# Patient Record
Sex: Female | Born: 1950
Health system: Southern US, Community
[De-identification: ages and names within clinical notes are randomized; demographics above are authoritative.]

## PROBLEM LIST (undated history)

## (undated) DIAGNOSIS — C4491 Basal cell carcinoma of skin, unspecified: Secondary | ICD-10-CM

## (undated) DIAGNOSIS — H409 Unspecified glaucoma: Secondary | ICD-10-CM

## (undated) DIAGNOSIS — Z8711 Personal history of peptic ulcer disease: Secondary | ICD-10-CM

## (undated) DIAGNOSIS — R011 Cardiac murmur, unspecified: Secondary | ICD-10-CM

## (undated) DIAGNOSIS — T7840XA Allergy, unspecified, initial encounter: Secondary | ICD-10-CM

## (undated) DIAGNOSIS — H269 Unspecified cataract: Secondary | ICD-10-CM

## (undated) DIAGNOSIS — Z9289 Personal history of other medical treatment: Secondary | ICD-10-CM

## (undated) DIAGNOSIS — K219 Gastro-esophageal reflux disease without esophagitis: Secondary | ICD-10-CM

## (undated) DIAGNOSIS — Z86010 Personal history of colonic polyps: Secondary | ICD-10-CM

## (undated) DIAGNOSIS — Z9889 Other specified postprocedural states: Secondary | ICD-10-CM

## (undated) DIAGNOSIS — Z8719 Personal history of other diseases of the digestive system: Secondary | ICD-10-CM

## (undated) DIAGNOSIS — R112 Nausea with vomiting, unspecified: Secondary | ICD-10-CM

## (undated) DIAGNOSIS — B019 Varicella without complication: Secondary | ICD-10-CM

## (undated) HISTORY — DX: Personal history of peptic ulcer disease: Z87.11

## (undated) HISTORY — DX: Basal cell carcinoma of skin, unspecified: C44.91

## (undated) HISTORY — DX: Personal history of other diseases of the digestive system: Z87.19

## (undated) HISTORY — DX: Allergy, unspecified, initial encounter: T78.40XA

## (undated) HISTORY — DX: Personal history of other medical treatment: Z92.89

## (undated) HISTORY — PX: SKIN CANCER EXCISION: SHX779

## (undated) HISTORY — DX: Cardiac murmur, unspecified: R01.1

## (undated) HISTORY — DX: Gastro-esophageal reflux disease without esophagitis: K21.9

## (undated) HISTORY — DX: Unspecified glaucoma: H40.9

## (undated) HISTORY — DX: Personal history of colonic polyps: Z86.010

## (undated) HISTORY — DX: Varicella without complication: B01.9

## (undated) HISTORY — PX: TUBAL LIGATION: SHX77

## (undated) HISTORY — DX: Nausea with vomiting, unspecified: Z98.890

## (undated) HISTORY — PX: CATARACT EXTRACTION, BILATERAL: SHX1313

## (undated) HISTORY — DX: Nausea with vomiting, unspecified: R11.2

## (undated) HISTORY — DX: Unspecified cataract: H26.9

---

## 1971-12-24 HISTORY — PX: MANDIBLE FRACTURE SURGERY: SHX706

## 2013-01-28 DIAGNOSIS — H269 Unspecified cataract: Secondary | ICD-10-CM | POA: Insufficient documentation

## 2013-01-28 DIAGNOSIS — H353190 Nonexudative age-related macular degeneration, unspecified eye, stage unspecified: Secondary | ICD-10-CM | POA: Insufficient documentation

## 2013-01-28 DIAGNOSIS — H43819 Vitreous degeneration, unspecified eye: Secondary | ICD-10-CM | POA: Insufficient documentation

## 2013-01-28 DIAGNOSIS — H15839 Staphyloma posticum, unspecified eye: Secondary | ICD-10-CM | POA: Insufficient documentation

## 2013-01-28 DIAGNOSIS — H4420 Degenerative myopia, unspecified eye: Secondary | ICD-10-CM | POA: Insufficient documentation

## 2013-01-28 DIAGNOSIS — H35439 Paving stone degeneration of retina, unspecified eye: Secondary | ICD-10-CM | POA: Insufficient documentation

## 2013-04-15 DIAGNOSIS — H35059 Retinal neovascularization, unspecified, unspecified eye: Secondary | ICD-10-CM | POA: Insufficient documentation

## 2013-04-15 DIAGNOSIS — H5213 Myopia, bilateral: Secondary | ICD-10-CM | POA: Insufficient documentation

## 2013-04-15 DIAGNOSIS — H43819 Vitreous degeneration, unspecified eye: Secondary | ICD-10-CM | POA: Insufficient documentation

## 2016-09-10 ENCOUNTER — Ambulatory Visit (INDEPENDENT_AMBULATORY_CARE_PROVIDER_SITE_OTHER): Payer: Commercial Managed Care - HMO | Admitting: Internal Medicine

## 2016-09-10 ENCOUNTER — Encounter: Payer: Self-pay | Admitting: Internal Medicine

## 2016-09-10 VITALS — BP 120/76 | HR 89 | Temp 97.8°F | Ht 65.0 in | Wt 195.8 lb

## 2016-09-10 DIAGNOSIS — R5383 Other fatigue: Secondary | ICD-10-CM | POA: Diagnosis not present

## 2016-09-10 DIAGNOSIS — Z23 Encounter for immunization: Secondary | ICD-10-CM

## 2016-09-10 DIAGNOSIS — E559 Vitamin D deficiency, unspecified: Secondary | ICD-10-CM

## 2016-09-10 LAB — COMPREHENSIVE METABOLIC PANEL
ALK PHOS: 71 U/L (ref 39–117)
ALT: 13 U/L (ref 0–35)
AST: 15 U/L (ref 0–37)
Albumin: 4.1 g/dL (ref 3.5–5.2)
BILIRUBIN TOTAL: 0.4 mg/dL (ref 0.2–1.2)
BUN: 15 mg/dL (ref 6–23)
CALCIUM: 9.4 mg/dL (ref 8.4–10.5)
CO2: 32 mEq/L (ref 19–32)
Chloride: 101 mEq/L (ref 96–112)
Creatinine, Ser: 0.56 mg/dL (ref 0.40–1.20)
GFR: 115.57 mL/min (ref >60.00)
GLUCOSE: 88 mg/dL (ref 70–99)
POTASSIUM: 5.1 meq/L (ref 3.5–5.1)
SODIUM: 138 meq/L (ref 135–145)
TOTAL PROTEIN: 6.9 g/dL (ref 6.0–8.3)

## 2016-09-10 LAB — CBC
HCT: 37.7 % (ref 36.0–46.0)
Hemoglobin: 12.8 g/dL (ref 12.0–15.0)
MCHC: 34 g/dL (ref 30.0–36.0)
MCV: 88.6 fl (ref 78.0–100.0)
Platelets: 331 10*3/uL (ref 150.0–400.0)
RBC: 4.26 Mil/uL (ref 3.87–5.11)
RDW: 14.6 % (ref 11.5–15.5)
WBC: 7.3 10*3/uL (ref 4.0–10.5)

## 2016-09-10 LAB — TSH: TSH: 1.91 u[IU]/mL (ref 0.35–4.50)

## 2016-09-10 LAB — VITAMIN D 25 HYDROXY (VIT D DEFICIENCY, FRACTURES): VITD: 23.56 ng/mL — AB (ref 30.00–100.00)

## 2016-09-10 LAB — VITAMIN B12: Vitamin B-12: 367 pg/mL (ref 211–911)

## 2016-09-10 LAB — FOLATE: FOLATE: 7.2 ng/mL (ref >5.9)

## 2016-09-10 NOTE — Progress Notes (Signed)
HPI  Seasonal Allergies: Worse in the spring and summer. It seems worse when it rains a lot. She takes Zyrtec daily with good relief.  History of Basal Cell Carcinoma: Removed off her face and left forearm. She is not currently following with a dermatologist.  GERD: Triggered by spicy food or foods containing MSG. She does not take anything for this.   History of Stomach Ulcers: 40 + years ago. She does not remember the cause of this. She has no current issues with this.  She also c/o fatigue. She reports this has been going on for a few months. She is sleeping at least 7 hours of sleep at night, she reports she does not feel rested when she wakes up. She denies nocturia. She is not sure if she snores.  Flu: 09/2015 Tetanus: unsure Zostovax: never Pap Smear: 2014 at gyn in [place]: 2014 at [place]: never Vision Screening: biannually Dentist: as needed  Past Medical History:  Diagnosis Date  . Allergy   . Basal cell carcinoma   . Chicken pox   . GERD (gastroesophageal reflux disease)   . History of blood transfusion   . History of stomach ulcers     Current Outpatient Prescriptions  Medication Sig Dispense Refill  . Calcium Carb-Cholecalciferol (CALCIUM 600/VITAMIN D3) 600-800 MG-UNIT TABS Take 2 tablets by mouth daily.    . cetirizine (ZYRTEC) 10 MG tablet Take 10 mg by mouth daily.    . Multiple Vitamins-Minerals (VISION FORMULA EYE HEALTH) CAPS Take 2 capsules by mouth daily.    . Multiple Vitamins-Minerals (WOMENS MULTIVITAMIN PO) Take 1 tablet by mouth daily.    Vladimir Faster Glycol-Propyl Glycol (SYSTANE OP) Apply 1 drop to eye daily.     No current facility-administered medications for this visit.     Allergies  Allergen Reactions  . Phenobarbital Other (See Comments)    syncope  . Codeine Nausea And Vomiting    Family History  Problem Relation Age of Onset  . Arthritis Mother   . Uterine cancer Mother   . Diabetes Mother   .  Heart disease Father   . Cancer Maternal Uncle   . Throat cancer Maternal Grandmother   . Diabetes Maternal Grandmother   . Prostate cancer Maternal Grandfather     Social History   Social History  . Marital status: Divorced    Spouse name: N/A  . Number of children: N/A  . Years of education: N/A   Occupational History  . Not on file.   Social History Main Topics  . Smoking status: Former Research scientist (life sciences)  . Smokeless tobacco: Never Used  . Alcohol use Yes  . Drug use: Unknown  . Sexual activity: Not on file   Other Topics Concern  . Not on file   Social History Narrative  . No narrative on file    ROS:  Constitutional: Pt reports fatigue. Denies fever, malaise, headache or abrupt weight changes.  HEENT: Denies eye pain, eye redness, ear pain, ringing in the ears, wax buildup, runny nose, nasal congestion, bloody nose, or sore throat. Respiratory: Denies difficulty breathing, shortness of breath, cough or sputum production.   Cardiovascular: Denies chest pain, chest tightness, palpitations or swelling in the hands or feet.  Gastrointestinal: Denies abdominal pain, bloating, constipation, diarrhea or blood in the stool.  GU: Denies frequency, urgency, pain with urination, blood in urine, odor or discharge. Musculoskeletal: Denies decrease in range of motion, difficulty with gait, muscle pain or joint pain and swelling.  Skin: Denies redness, rashes, lesions or ulcercations.  Neurological: Denies dizziness, difficulty with memory, difficulty with speech or problems with balance and coordination.  Psych: Denies anxiety, depression, SI/HI.  No other specific complaints in a complete review of systems (except as listed in HPI above).  PE:  BP 120/76   Pulse 89   Temp 97.8 F (36.6 C) (Oral)   Ht 5\' 5"  (1.651 m)   Wt 195 lb 12 oz (88.8 kg)   SpO2 99%   BMI 32.57 kg/m  Wt Readings from Last 3 Encounters:  09/10/16 195 lb 12 oz (88.8 kg)    General: Appears her stated  age, well developed, well nourished in NAD. Neck: Neck supple, trachea midline. No masses, lumps or thyromegaly present.  Cardiovascular: Normal rate and rhythm. S1,S2 noted.  No murmur, rubs or gallops noted.  Pulmonary/Chest: Normal effort and positive vesicular breath sounds. No respiratory distress. No wheezes, rales or ronchi noted.  Musculoskeletal:  No difficulty with gait.  Neurological: Alert and oriented.  Psychiatric: Mood and affect normal. Behavior is normal. Judgment and thought content normal.    Assessment and Plan:  Fatigue:  Will check CBC, CMET, A1C, TSH, B12, Folate and Vit D levels today Flu shot today  Will follow up after labs, make an appt for your annual exam Webb Silversmith, NP

## 2016-09-10 NOTE — Patient Instructions (Signed)
Fatigue  Fatigue is feeling tired all of the time, a lack of energy, or a lack of motivation. Occasional or mild fatigue is often a normal response to activity or life in general. However, long-lasting (chronic) or extreme fatigue may indicate an underlying medical condition.  HOME CARE INSTRUCTIONS   Watch your fatigue for any changes. The following actions may help to lessen any discomfort you are feeling:  · Talk to your health care provider about how much sleep you need each night. Try to get the required amount every night.  · Take medicines only as directed by your health care provider.  · Eat a healthy and nutritious diet. Ask your health care provider if you need help changing your diet.  · Drink enough fluid to keep your urine clear or pale yellow.  · Practice ways of relaxing, such as yoga, meditation, massage therapy, or acupuncture.  · Exercise regularly.    · Change situations that cause you stress. Try to keep your work and personal routine reasonable.  · Do not abuse illegal drugs.  · Limit alcohol intake to no more than 1 drink per day for nonpregnant women and 2 drinks per day for men. One drink equals 12 ounces of beer, 5 ounces of wine, or 1½ ounces of hard liquor.  · Take a multivitamin, if directed by your health care provider.  SEEK MEDICAL CARE IF:   · Your fatigue does not get better.  · You have a fever.    · You have unintentional weight loss or gain.  · You have headaches.    · You have difficulty:      Falling asleep.    Sleeping throughout the night.  · You feel angry, guilty, anxious, or sad.     · You are unable to have a bowel movement (constipation).    · You skin is dry.     · Your legs or another part of your body is swollen.    SEEK IMMEDIATE MEDICAL CARE IF:   · You feel confused.    · Your vision is blurry.  · You feel faint or pass out.    · You have a severe headache.    · You have severe abdominal, pelvic, or back pain.    · You have chest pain, shortness of breath, or an  irregular or fast heartbeat.    · You are unable to urinate or you urinate less than normal.    · You develop abnormal bleeding, such as bleeding from the rectum, vagina, nose, lungs, or nipples.  · You vomit blood.     · You have thoughts about harming yourself or committing suicide.    · You are worried that you might harm someone else.       This information is not intended to replace advice given to you by your health care provider. Make sure you discuss any questions you have with your health care provider.     Document Released: 10/06/2007 Document Revised: 12/30/2014 Document Reviewed: 04/12/2014  Elsevier Interactive Patient Education ©2016 [employer]

## 2016-09-10 NOTE — Addendum Note (Signed)
Addended by: Lurlean Nanny on: 09/10/2016 02:59 PM   Modules accepted: Orders

## 2016-09-11 MED ORDER — VITAMIN D (ERGOCALCIFEROL) 1.25 MG (50000 UNIT) PO CAPS: 50000.0000 [IU] | ORAL_CAPSULE | ORAL | 0 refills | Status: DC

## 2016-09-11 NOTE — Addendum Note (Signed)
Addended by: Lindalou Hose Y on: 09/11/2016 05:00 PM   Modules accepted: Orders

## 2016-12-03 ENCOUNTER — Other Ambulatory Visit: Payer: Self-pay | Admitting: Internal Medicine

## 2016-12-03 DIAGNOSIS — E559 Vitamin D deficiency, unspecified: Secondary | ICD-10-CM

## 2017-06-04 DIAGNOSIS — H442E3 Degenerative myopia with other maculopathy, bilateral eye: Secondary | ICD-10-CM | POA: Diagnosis not present

## 2017-06-04 DIAGNOSIS — H43813 Vitreous degeneration, bilateral: Secondary | ICD-10-CM | POA: Diagnosis not present

## 2017-06-04 DIAGNOSIS — H35433 Paving stone degeneration of retina, bilateral: Secondary | ICD-10-CM | POA: Diagnosis not present

## 2017-07-17 ENCOUNTER — Encounter: Payer: Self-pay | Admitting: Internal Medicine

## 2017-07-17 ENCOUNTER — Ambulatory Visit (INDEPENDENT_AMBULATORY_CARE_PROVIDER_SITE_OTHER): Payer: Medicare Other | Admitting: Internal Medicine

## 2017-07-17 VITALS — BP 118/70 | HR 78 | Temp 98.0°F | Ht 64.75 in | Wt 193.5 lb

## 2017-07-17 DIAGNOSIS — Z0000 Encounter for general adult medical examination without abnormal findings: Secondary | ICD-10-CM

## 2017-07-17 DIAGNOSIS — Z1159 Encounter for screening for other viral diseases: Secondary | ICD-10-CM | POA: Diagnosis not present

## 2017-07-17 DIAGNOSIS — Z23 Encounter for immunization: Secondary | ICD-10-CM

## 2017-07-17 DIAGNOSIS — Z1211 Encounter for screening for malignant neoplasm of colon: Secondary | ICD-10-CM

## 2017-07-17 DIAGNOSIS — Z114 Encounter for screening for human immunodeficiency virus [HIV]: Secondary | ICD-10-CM

## 2017-07-17 NOTE — Progress Notes (Signed)
HPI:  Pt presents to the clinic today for her Welcome to Medicare Exam.  Past Medical History:  Diagnosis Date  . Allergy   . Basal cell carcinoma   . Chicken pox   . GERD (gastroesophageal reflux disease)   . History of blood transfusion   . History of stomach ulcers     Current Outpatient Prescriptions  Medication Sig Dispense Refill  . Calcium Carb-Cholecalciferol (CALCIUM 600/VITAMIN D3) 600-800 MG-UNIT TABS Take 2 tablets by mouth daily.    . cetirizine (ZYRTEC) 10 MG tablet Take 10 mg by mouth daily.    . Multiple Vitamins-Minerals (VISION FORMULA EYE HEALTH) CAPS Take 2 capsules by mouth daily.    . Multiple Vitamins-Minerals (WOMENS MULTIVITAMIN PO) Take 1 tablet by mouth daily.    Vladimir Faster Glycol-Propyl Glycol (SYSTANE OP) Apply 1 drop to eye daily.    . Vitamin D, Ergocalciferol, (DRISDOL) 50000 units CAPS capsule Take 1 capsule (50,000 Units total) by mouth every 7 (seven) days. 12 capsule 0   No current facility-administered medications for this visit.     Allergies  Allergen Reactions  . Phenobarbital Other (See Comments)    syncope  . Codeine Nausea And Vomiting    Family History  Problem Relation Age of Onset  . Arthritis Mother   . Uterine cancer Mother   . Diabetes Mother   . Heart disease Father   . Cancer Maternal Uncle   . Throat cancer Maternal Grandmother   . Diabetes Maternal Grandmother   . Prostate cancer Maternal Grandfather     Social History   Social History  . Marital status: Divorced    Spouse name: N/A  . Number of children: N/A  . Years of education: N/A   Occupational History  . Not on file.   Social History Main Topics  . Smoking status: Former Research scientist (life sciences)  . Smokeless tobacco: Never Used  . Alcohol use 0.6 oz/week    1 Shots of liquor per week  . Drug use: No  . Sexual activity: Not Currently   Other Topics Concern  . Not on file   Social History Narrative  . No narrative on file    Hospitiliaztions:  None  Health Maintenance:    Flu: 08/2016  Tetanus: unsure  Pneumovax: never  Prevnar: never  Zostavax: never  Mammogram: 2014  Pap Smear: 2014  Bone Density: 2014  Colon Screening: never   Eye Doctor: biannaully  Dental Exam: as needed   Providers:   PCP: Webb Silversmith, NP-C   I have personally reviewed and have noted:  1. The patient's medical and social history 2. Their use of alcohol, tobacco or illicit drugs 3. Their current medications and supplements 4. The patient's functional ability including ADL's, fall risks, home safety risks and hearing or visual impairment. 5. Diet and physical activities 6. Evidence for depression or mood disorder  Subjective:   Review of Systems:   Constitutional: Denies fever, malaise, fatigue, headache or abrupt weight changes.  HEENT: Denies eye pain, eye redness, ear pain, ringing in the ears, wax buildup, runny nose, nasal congestion, bloody nose, or sore throat. Respiratory: Denies difficulty breathing, shortness of breath, cough or sputum production.   Cardiovascular: Denies chest pain, chest tightness, palpitations or swelling in the hands or feet.  Gastrointestinal: Denies abdominal pain, bloating, constipation, diarrhea or blood in the stool.  GU: Denies urgency, frequency, pain with urination, burning sensation, blood in urine, odor or discharge. Musculoskeletal: Denies decrease in range of motion, difficulty with  gait, muscle pain or joint pain and swelling.  Skin: Denies redness, rashes, lesions or ulcercations.  Neurological: Denies dizziness, difficulty with memory, difficulty with speech or problems with balance and coordination.  Psych: Denies anxiety, depression, SI/HI.  No other specific complaints in a complete review of systems (except as listed in HPI above).  Objective:  PE:   BP 118/70   Pulse 78   Temp 98 F (36.7 C) (Oral)   Ht 5' 4.75" (1.645 m)   Wt 193 lb 8 oz (87.8 kg)   SpO2 98%   BMI 32.45 kg/m    Wt Readings from Last 3 Encounters:  09/10/16 195 lb 12 oz (88.8 kg)    General: Appears herstated age, obese in NAD. Skin: Warm, dry and intact.  HEENT: Head: normal shape and size; Eyes: sclera white, no icterus, conjunctiva pink, PERRLA and EOMs intact; Ears: Tm's gray and intact, normal light reflex; Throat/Mouth: Teeth present, mucosa pink and moist, no exudate, lesions or ulcerations noted.  Neck: Neck supple, trachea midline. No masses, lumps or thyromegaly present.  Cardiovascular: Normal rate and rhythm. S1,S2 noted.  Slight murmur noted. No JVD or BLE edema. No carotid bruits noted. Pulmonary/Chest: Normal effort and positive vesicular breath sounds. No respiratory distress. No wheezes, rales or ronchi noted.  Abdomen: Soft and nontender. Normal bowel sounds. No distention or masses noted. Liver, spleen and kidneys non palpable. Musculoskeletal: Strength 5/5 BUE/BLE. No signs of joint swelling.  Neurological: Alert and oriented. Cranial nerves II-XII grossly intact. Coordination normal.  Psychiatric: Mood and affect normal. Behavior is normal. Judgment and thought content normal.    BMET    Component Value Date/Time   NA 138 09/10/2016 1027   K 5.1 09/10/2016 1027   CL 101 09/10/2016 1027   CO2 32 09/10/2016 1027   GLUCOSE 88 09/10/2016 1027   BUN 15 09/10/2016 1027   CREATININE 0.56 09/10/2016 1027   CALCIUM 9.4 09/10/2016 1027    Lipid Panel  No results found for: CHOL, TRIG, HDL, CHOLHDL, VLDL, LDLCALC  CBC    Component Value Date/Time   WBC 7.3 09/10/2016 1027   RBC 4.26 09/10/2016 1027   HGB 12.8 09/10/2016 1027   HCT 37.7 09/10/2016 1027   PLT 331.0 09/10/2016 1027   MCV 88.6 09/10/2016 1027   MCHC 34.0 09/10/2016 1027   RDW 14.6 09/10/2016 1027    Hgb A1C No results found for: HGBA1C    Assessment and Plan:   Medicare Annual Wellness Visit:  Diet: She does eat meat. She consumes fruits and veggies daily. She does not eat fried foods. She  drinks mostly coffee and water. Physical activity: None Depression/mood screen: Negative Hearing: Intact to whispered voice Visual acuity: Grossly normal, performs annual eye exam  ADLs: Capable Fall risk: None Home safety: Good Cognitive evaluation: Intact to orientation, naming, recall and repetition EOL planning: Adv directives, DNR/I disagree  Preventative Medicine: Encouraged her to get a flu shot in the fall. She declines tetanus booster due to financial reasons. Prevnar today. She will get Pneumovax in 1 year. Advised her to call her insurance to find out about the coverage of Shingrix. She declines pap smear, mammorgam or bone density. Referral placed to GI for screening colonoscopy. Encouraged her to consume a balanced diet and exercise regimen. Advised her to see an eye doctor and dentist annually. Will check CBC, CMET, Lipid, HIV and Hep C today. ECG today normal.    Next appointment: 1 year, Medicare Wellness Exam   Mirielle Byrum,  Quindell Shere, NP

## 2017-07-17 NOTE — Patient Instructions (Signed)
Health Maintenance for Postmenopausal Women Menopause is a normal process in which your reproductive ability comes to an end. This process happens gradually over a span of months to years, usually between the ages of 22 and 9. Menopause is complete when you have missed 12 consecutive menstrual periods. It is important to talk with your health care provider about some of the most common conditions that affect postmenopausal women, such as heart disease, cancer, and bone loss (osteoporosis). Adopting a healthy lifestyle and getting preventive care can help to promote your health and wellness. Those actions can also lower your chances of developing some of these common conditions. What should I know about menopause? During menopause, you may experience a number of symptoms, such as:  Moderate-to-severe hot flashes.  Night sweats.  Decrease in sex drive.  Mood swings.  Headaches.  Tiredness.  Irritability.  Memory problems.  Insomnia.  Choosing to treat or not to treat menopausal changes is an individual decision that you make with your health care provider. What should I know about hormone replacement therapy and supplements? Hormone therapy products are effective for treating symptoms that are associated with menopause, such as hot flashes and night sweats. Hormone replacement carries certain risks, especially as you become older. If you are thinking about using estrogen or estrogen with progestin treatments, discuss the benefits and risks with your health care provider. What should I know about heart disease and stroke? Heart disease, heart attack, and stroke become more likely as you age. This may be due, in part, to the hormonal changes that your body experiences during menopause. These can affect how your body processes dietary fats, triglycerides, and cholesterol. Heart attack and stroke are both medical emergencies. There are many things that you can do to help prevent heart disease  and stroke:  Have your blood pressure checked at least every 1-2 years. High blood pressure causes heart disease and increases the risk of stroke.  If you are 53-22 years old, ask your health care provider if you should take aspirin to prevent a heart attack or a stroke.  Do not use any tobacco products, including cigarettes, chewing tobacco, or electronic cigarettes. If you need help quitting, ask your health care provider.  It is important to eat a healthy diet and maintain a healthy weight. ? Be sure to include plenty of vegetables, fruits, low-fat dairy products, and lean protein. ? Avoid eating foods that are high in solid fats, added sugars, or salt (sodium).  Get regular exercise. This is one of the most important things that you can do for your health. ? Try to exercise for at least 150 minutes each week. The type of exercise that you do should increase your heart rate and make you sweat. This is known as moderate-intensity exercise. ? Try to do strengthening exercises at least twice each week. Do these in addition to the moderate-intensity exercise.  Know your numbers.Ask your health care provider to check your cholesterol and your blood glucose. Continue to have your blood tested as directed by your health care provider.  What should I know about cancer screening? There are several types of cancer. Take the following steps to reduce your risk and to catch any cancer development as early as possible. Breast Cancer  Practice breast self-awareness. ? This means understanding how your breasts normally appear and feel. ? It also means doing regular breast self-exams. Let your health care provider know about any changes, no matter how small.  If you are 40  or older, have a clinician do a breast exam (clinical breast exam or CBE) every year. Depending on your age, family history, and medical history, it may be recommended that you also have a yearly breast X-ray (mammogram).  If you  have a family history of breast cancer, talk with your health care provider about genetic screening.  If you are at high risk for breast cancer, talk with your health care provider about having an MRI and a mammogram every year.  Breast cancer (BRCA) gene test is recommended for women who have family members with BRCA-related cancers. Results of the assessment will determine the need for genetic counseling and BRCA1 and for BRCA2 testing. BRCA-related cancers include these types: ? Breast. This occurs in males or females. ? Ovarian. ? Tubal. This may also be called fallopian tube cancer. ? Cancer of the abdominal or pelvic lining (peritoneal cancer). ? Prostate. ? Pancreatic.  Cervical, Uterine, and Ovarian Cancer Your health care provider may recommend that you be screened regularly for cancer of the pelvic organs. These include your ovaries, uterus, and vagina. This screening involves a pelvic exam, which includes checking for microscopic changes to the surface of your cervix (Pap test).  For women ages 21-65, health care providers may recommend a pelvic exam and a Pap test every three years. For women ages 79-65, they may recommend the Pap test and pelvic exam, combined with testing for human papilloma virus (HPV), every five years. Some types of HPV increase your risk of cervical cancer. Testing for HPV may also be done on women of any age who have unclear Pap test results.  Other health care providers may not recommend any screening for nonpregnant women who are considered low risk for pelvic cancer and have no symptoms. Ask your health care provider if a screening pelvic exam is right for you.  If you have had past treatment for cervical cancer or a condition that could lead to cancer, you need Pap tests and screening for cancer for at least 20 years after your treatment. If Pap tests have been discontinued for you, your risk factors (such as having a new sexual partner) need to be  reassessed to determine if you should start having screenings again. Some women have medical problems that increase the chance of getting cervical cancer. In these cases, your health care provider may recommend that you have screening and Pap tests more often.  If you have a family history of uterine cancer or ovarian cancer, talk with your health care provider about genetic screening.  If you have vaginal bleeding after reaching menopause, tell your health care provider.  There are currently no reliable tests available to screen for ovarian cancer.  Lung Cancer Lung cancer screening is recommended for adults 69-62 years old who are at high risk for lung cancer because of a history of smoking. A yearly low-dose CT scan of the lungs is recommended if you:  Currently smoke.  Have a history of at least 30 pack-years of smoking and you currently smoke or have quit within the past 15 years. A pack-year is smoking an average of one pack of cigarettes per day for one year.  Yearly screening should:  Continue until it has been 15 years since you quit.  Stop if you develop a health problem that would prevent you from having lung cancer treatment.  Colorectal Cancer  This type of cancer can be detected and can often be prevented.  Routine colorectal cancer screening usually begins at  age 42 and continues through age 45.  If you have risk factors for colon cancer, your health care provider may recommend that you be screened at an earlier age.  If you have a family history of colorectal cancer, talk with your health care provider about genetic screening.  Your health care provider may also recommend using home test kits to check for hidden blood in your stool.  A small camera at the end of a tube can be used to examine your colon directly (sigmoidoscopy or colonoscopy). This is done to check for the earliest forms of colorectal cancer.  Direct examination of the colon should be repeated every  5-10 years until age 71. However, if early forms of precancerous polyps or small growths are found or if you have a family history or genetic risk for colorectal cancer, you may need to be screened more often.  Skin Cancer  Check your skin from head to toe regularly.  Monitor any moles. Be sure to tell your health care provider: ? About any new moles or changes in moles, especially if there is a change in a mole's shape or color. ? If you have a mole that is larger than the size of a pencil eraser.  If any of your family members has a history of skin cancer, especially at a young age, talk with your health care provider about genetic screening.  Always use sunscreen. Apply sunscreen liberally and repeatedly throughout the day.  Whenever you are outside, protect yourself by wearing long sleeves, pants, a wide-brimmed hat, and sunglasses.  What should I know about osteoporosis? Osteoporosis is a condition in which bone destruction happens more quickly than new bone creation. After menopause, you may be at an increased risk for osteoporosis. To help prevent osteoporosis or the bone fractures that can happen because of osteoporosis, the following is recommended:  If you are 46-71 years old, get at least 1,000 mg of calcium and at least 600 mg of vitamin D per day.  If you are older than age 55 but younger than age 65, get at least 1,200 mg of calcium and at least 600 mg of vitamin D per day.  If you are older than age 54, get at least 1,200 mg of calcium and at least 800 mg of vitamin D per day.  Smoking and excessive alcohol intake increase the risk of osteoporosis. Eat foods that are rich in calcium and vitamin D, and do weight-bearing exercises several times each week as directed by your health care provider. What should I know about how menopause affects my mental health? Depression may occur at any age, but it is more common as you become older. Common symptoms of depression  include:  Low or sad mood.  Changes in sleep patterns.  Changes in appetite or eating patterns.  Feeling an overall lack of motivation or enjoyment of activities that you previously enjoyed.  Frequent crying spells.  Talk with your health care provider if you think that you are experiencing depression. What should I know about immunizations? It is important that you get and maintain your immunizations. These include:  Tetanus, diphtheria, and pertussis (Tdap) booster vaccine.  Influenza every year before the flu season begins.  Pneumonia vaccine.  Shingles vaccine.  Your health care provider may also recommend other immunizations. This information is not intended to replace advice given to you by your health care provider. Make sure you discuss any questions you have with your health care provider. Document Released: 01/31/2006  Document Revised: 06/28/2016 Document Reviewed: 09/12/2015 Elsevier Interactive Patient Education  2018 [employer]  

## 2017-07-18 LAB — LIPID PANEL
CHOL/HDL RATIO: 3
Cholesterol: 242 mg/dL — ABNORMAL HIGH (ref 0–200)
HDL: 78.5 mg/dL (ref >39.00)
LDL Cholesterol: 150 mg/dL — ABNORMAL HIGH (ref 0–99)
NONHDL: 163.05
Triglycerides: 66 mg/dL (ref 0.0–149.0)
VLDL: 13.2 mg/dL (ref 0.0–40.0)

## 2017-07-18 LAB — CBC
HEMATOCRIT: 35.9 % — AB (ref 36.0–46.0)
Hemoglobin: 12 g/dL (ref 12.0–15.0)
MCHC: 33.4 g/dL (ref 30.0–36.0)
MCV: 91.4 fl (ref 78.0–100.0)
Platelets: 358 10*3/uL (ref 150.0–400.0)
RBC: 3.92 Mil/uL (ref 3.87–5.11)
RDW: 14.5 % (ref 11.5–15.5)
WBC: 8.4 10*3/uL (ref 4.0–10.5)

## 2017-07-18 LAB — COMPREHENSIVE METABOLIC PANEL
ALK PHOS: 46 U/L (ref 39–117)
ALT: 13 U/L (ref 0–35)
AST: 19 U/L (ref 0–37)
Albumin: 4.2 g/dL (ref 3.5–5.2)
BUN: 18 mg/dL (ref 6–23)
CO2: 27 meq/L (ref 19–32)
Calcium: 10 mg/dL (ref 8.4–10.5)
Chloride: 99 mEq/L (ref 96–112)
Creatinine, Ser: 0.7 mg/dL (ref 0.40–1.20)
GFR: 89.1 mL/min (ref >60.00)
GLUCOSE: 86 mg/dL (ref 70–99)
Potassium: 4.8 mEq/L (ref 3.5–5.1)
SODIUM: 134 meq/L — AB (ref 135–145)
TOTAL PROTEIN: 7.4 g/dL (ref 6.0–8.3)
Total Bilirubin: 0.3 mg/dL (ref 0.2–1.2)

## 2017-07-18 LAB — HIV ANTIBODY (ROUTINE TESTING W REFLEX): HIV: NONREACTIVE

## 2017-07-18 LAB — HEPATITIS C ANTIBODY: HCV Ab: NEGATIVE

## 2017-07-18 NOTE — Addendum Note (Signed)
Addended by: Lurlean Nanny on: 07/18/2017 10:01 AM   Modules accepted: Orders

## 2017-07-25 ENCOUNTER — Telehealth: Payer: Self-pay | Admitting: Internal Medicine

## 2017-07-25 NOTE — Telephone Encounter (Signed)
Placed on Melanie's desk.

## 2017-07-25 NOTE — Telephone Encounter (Signed)
Pt came in and is asking if you can review her MOST form that she has filled out. She thinks that it needs to be signed also.  Additionally, she has dropped off Declaration for [employer] and [employer] of [employer].   Placing in rx tower.

## 2017-07-29 NOTE — Telephone Encounter (Signed)
Placed in your box to review 

## 2017-08-04 NOTE — Telephone Encounter (Signed)
Reviewed, signed and placed in MYD box

## 2017-08-06 NOTE — Telephone Encounter (Signed)
Copy sent to be scanned... Original mailed to pt

## 2017-08-13 ENCOUNTER — Ambulatory Visit (AMBULATORY_SURGERY_CENTER): Payer: Self-pay | Admitting: *Deleted

## 2017-08-13 VITALS — Ht 65.0 in | Wt 190.0 lb

## 2017-08-13 DIAGNOSIS — Z1211 Encounter for screening for malignant neoplasm of colon: Secondary | ICD-10-CM

## 2017-08-13 NOTE — Progress Notes (Signed)
Patient denies any allergies to eggs or soy. Patient has post-op n&v and hard to wake up with anesthesia. Patient denies any oxygen use at home and does not take any diet/weight loss medications. EMMI education assisgned to patient on colonoscopy, this was explained and instructions given to patient.

## 2017-08-27 ENCOUNTER — Encounter: Payer: Self-pay | Admitting: Internal Medicine

## 2017-08-27 ENCOUNTER — Ambulatory Visit (AMBULATORY_SURGERY_CENTER): Payer: Medicare Other | Admitting: Internal Medicine

## 2017-08-27 ENCOUNTER — Other Ambulatory Visit: Payer: Self-pay | Admitting: Internal Medicine

## 2017-08-27 VITALS — BP 138/75 | HR 73 | Temp 98.6°F | Resp 21 | Ht 65.0 in | Wt 190.0 lb

## 2017-08-27 DIAGNOSIS — D123 Benign neoplasm of transverse colon: Secondary | ICD-10-CM

## 2017-08-27 DIAGNOSIS — K635 Polyp of colon: Secondary | ICD-10-CM | POA: Diagnosis not present

## 2017-08-27 DIAGNOSIS — Z1211 Encounter for screening for malignant neoplasm of colon: Secondary | ICD-10-CM

## 2017-08-27 NOTE — Patient Instructions (Addendum)
I found and removed one small polyp that looks benign.  I will let you know pathology results and when to have another routine colonoscopy by mail and/or My Chart.  You also have a condition called diverticulosis - common and not usually a problem. Please read the handout provided.  I appreciate the opportunity to care for you. Gatha Mayer, MD, FACG    YOU HAD AN ENDOSCOPIC PROCEDURE TODAY AT [facility]:   Refer to the procedure report that was given to you for any specific questions about what was found during the examination.  If the procedure report does not answer your questions, please call your gastroenterologist to clarify.  If you requested that your care partner not be given the details of your procedure findings, then the procedure report has been included in a sealed envelope for you to review at your convenience later.  YOU SHOULD EXPECT: Some feelings of bloating in the abdomen. Passage of more gas than usual.  Walking can help get rid of the air that was put into your GI tract during the procedure and reduce the bloating. If you had a lower endoscopy (such as a colonoscopy or flexible sigmoidoscopy) you may notice spotting of blood in your stool or on the toilet paper. If you underwent a bowel prep for your procedure, you may not have a normal bowel movement for a few days.  Please Note:  You might notice some irritation and congestion in your nose or some drainage.  This is from the oxygen used during your procedure.  There is no need for concern and it should clear up in a day or so.  SYMPTOMS TO REPORT IMMEDIATELY:   Following lower endoscopy (colonoscopy or flexible sigmoidoscopy):  Excessive amounts of blood in the stool  Significant tenderness or worsening of abdominal pains  Swelling of the abdomen that is new, acute  Fever of 100F or higher   For urgent or emergent issues, a gastroenterologist can be reached at any hour by calling [phone]  [phone].   DIET:  We do recommend a small meal at first, but then you may proceed to your regular diet.  Drink plenty of fluids but you should avoid alcoholic beverages for 24 hours.  ACTIVITY:  You should plan to take it easy for the rest of today and you should NOT DRIVE or use heavy machinery until tomorrow (because of the sedation medicines used during the test).    FOLLOW UP: Our staff will call the number listed on your records the next business day following your procedure to check on you and address any questions or concerns that you may have regarding the information given to you following your procedure. If we do not reach you, we will leave a message.  However, if you are feeling well and you are not experiencing any problems, there is no need to return our call.  We will assume that you have returned to your regular daily activities without incident.  If any biopsies were taken you will be contacted by phone or by letter within the next 1-3 weeks.  Please call us at [phone] if you have not heard about the biopsies in 3 weeks.    SIGNATURES/CONFIDENTIALITY: You and/or your care partner have signed paperwork which will be entered into your electronic medical record.  These signatures attest to the fact that that the information above on your After Visit Summary has been reviewed and is understood.  Full responsibility of  the confidentiality of this discharge information lies with you and/or your care-partner.    Handouts were given to your care partner on polyps and diverticulosis. You may resume your current medications today. Await biopsy results. Please call if any questions or concerns.

## 2017-08-27 NOTE — Progress Notes (Signed)
No problems noted in the recovery room. maw 

## 2017-08-27 NOTE — Progress Notes (Signed)
Pt's states no medical or surgical changes since previsit or office visit. 

## 2017-08-27 NOTE — Progress Notes (Signed)
Called to room to assist during endoscopic procedure.  Patient ID and intended procedure confirmed with present staff. Received instructions for my participation in the procedure from the performing physician.  

## 2017-08-27 NOTE — Progress Notes (Signed)
To PACU, VSS. Report to [email or web address] 

## 2017-08-27 NOTE — Op Note (Signed)
[facility] Patient Name: Terri Crawford Procedure Date: 08/27/2017 1:53 PM MRN: [id number] Endoscopist: Gatha Mayer , MD Age: 66 Referring MD:  Date of Birth: 01/14/1951 Gender: Female Account #: [id number] Procedure:                Colonoscopy Indications:              Screening for colorectal malignant neoplasm, This                            is the patient's first colonoscopy Medicines:                Propofol per Anesthesia, Monitored Anesthesia Care Procedure:                Pre-Anesthesia Assessment:                           - Prior to the procedure, a History and Physical                            was performed, and patient medications and                            allergies were reviewed. The patient's tolerance of                            previous anesthesia was also reviewed. The risks                            and benefits of the procedure and the sedation                            options and risks were discussed with the patient.                            All questions were answered, and informed consent                            was obtained. Prior Anticoagulants: The patient has                            taken no previous anticoagulant or antiplatelet                            agents. ASA Grade Assessment: II - A patient with                            mild systemic disease. After reviewing the risks                            and benefits, the patient was deemed in                            satisfactory condition to undergo the procedure.  After obtaining informed consent, the colonoscope                            was passed under direct vision. Throughout the                            procedure, the patient's blood pressure, pulse, and                            oxygen saturations were monitored continuously. The                            Colonoscope was introduced through the anus and   advanced to the the cecum, identified by                            appendiceal orifice and ileocecal valve. The                            colonoscopy was performed without difficulty. The                            patient tolerated the procedure well. The quality                            of the bowel preparation was excellent. The bowel                            preparation used was Miralax. The ileocecal valve,                            appendiceal orifice, and rectum were photographed. Scope In: 2:09:32 PM Scope Out: 2:23:36 PM Scope Withdrawal Time: 0 hours 10 minutes 12 seconds  Total Procedure Duration: 0 hours 14 minutes 4 seconds  Findings:                 The perianal and digital rectal examinations were                            normal.                           A 5 mm polyp was found in the transverse colon. The                            polyp was sessile. The polyp was removed with a                            cold snare. Resection and retrieval were complete.                            Verification of patient identification for the                            specimen  was done. Estimated blood loss was minimal.                           Multiple small and large-mouthed diverticula were                            found in the sigmoid colon.                           The exam was otherwise without abnormality on                            direct and retroflexion views. Complications:            No immediate complications. Estimated Blood Loss:     Estimated blood loss was minimal. Impression:               - One 5 mm polyp in the transverse colon, removed                            with a cold snare. Resected and retrieved.                           - Diverticulosis in the sigmoid colon.                           - The examination was otherwise normal on direct                            and retroflexion views. Recommendation:           - Patient has a contact number  available for                            emergencies. The signs and symptoms of potential                            delayed complications were discussed with the                            patient. Return to normal activities tomorrow.                            Written discharge instructions were provided to the                            patient.                           - Resume previous diet.                           - Continue present medications.                           - Repeat colonoscopy is recommended. The  colonoscopy date will be determined after pathology                            results from today's exam become available for                            review. Gatha Mayer, MD 08/27/2017 2:27:49 PM This report has been signed electronically.

## 2017-08-28 ENCOUNTER — Telehealth: Payer: Self-pay | Admitting: *Deleted

## 2017-08-28 NOTE — Telephone Encounter (Signed)
  Follow up Call-  Call back number 08/27/2017  Post procedure Call Back phone  # [phone]  Permission to leave phone message Yes     Patient questions:  Do you have a fever, pain , or abdominal swelling? No. Pain Score  0 *  Have you tolerated food without any problems? Yes.    Have you been able to return to your normal activities? Yes.    Do you have any questions about your discharge instructions: Diet   No. Medications  No. Follow up visit  No.  Do you have questions or concerns about your Care? No.  Actions: * If pain score is 4 or above: No action needed, pain <4.

## 2017-09-05 ENCOUNTER — Encounter: Payer: Self-pay | Admitting: Internal Medicine

## 2017-09-05 DIAGNOSIS — Z86010 Personal history of colonic polyps: Secondary | ICD-10-CM | POA: Insufficient documentation

## 2017-09-05 DIAGNOSIS — Z860101 Personal history of adenomatous and serrated colon polyps: Secondary | ICD-10-CM

## 2017-09-05 HISTORY — DX: Personal history of colonic polyps: Z86.010

## 2017-09-05 HISTORY — DX: Personal history of adenomatous and serrated colon polyps: Z86.0101

## 2017-09-05 NOTE — Progress Notes (Signed)
5 mm adenoma recall 2023 My Chart letter

## 2017-10-09 ENCOUNTER — Other Ambulatory Visit: Payer: Self-pay | Admitting: Internal Medicine

## 2017-10-09 DIAGNOSIS — E7800 Pure hypercholesterolemia, unspecified: Secondary | ICD-10-CM

## 2017-10-20 ENCOUNTER — Other Ambulatory Visit: Payer: 59

## 2018-01-16 DIAGNOSIS — H15833 Staphyloma posticum, bilateral: Secondary | ICD-10-CM | POA: Diagnosis not present

## 2018-01-16 DIAGNOSIS — H35433 Paving stone degeneration of retina, bilateral: Secondary | ICD-10-CM | POA: Diagnosis not present

## 2018-01-16 DIAGNOSIS — H43813 Vitreous degeneration, bilateral: Secondary | ICD-10-CM | POA: Diagnosis not present

## 2018-01-16 DIAGNOSIS — H442E3 Degenerative myopia with other maculopathy, bilateral eye: Secondary | ICD-10-CM | POA: Diagnosis not present

## 2018-02-19 ENCOUNTER — Ambulatory Visit: Payer: Self-pay | Admitting: Internal Medicine

## 2018-02-26 ENCOUNTER — Encounter: Payer: Self-pay | Admitting: Internal Medicine

## 2018-02-26 ENCOUNTER — Ambulatory Visit (INDEPENDENT_AMBULATORY_CARE_PROVIDER_SITE_OTHER): Payer: Medicare Other | Admitting: Internal Medicine

## 2018-02-26 VITALS — BP 128/82 | HR 78 | Temp 98.2°F | Wt 189.0 lb

## 2018-02-26 DIAGNOSIS — R5383 Other fatigue: Secondary | ICD-10-CM

## 2018-02-26 DIAGNOSIS — G4700 Insomnia, unspecified: Secondary | ICD-10-CM

## 2018-02-26 DIAGNOSIS — Z23 Encounter for immunization: Secondary | ICD-10-CM | POA: Diagnosis not present

## 2018-02-26 NOTE — Patient Instructions (Signed)

## 2018-02-26 NOTE — Progress Notes (Signed)
Subjective:    Patient ID: Terri Crawford, female    DOB: 1951-10-18, 67 y.o.   MRN: [id number]  HPI  Pt presents to the clinic today with c/o fatigue and insomnia. She reports this has been going on 2-3 weeks now. She feels tired during the day, because she isn't sleeping well at night. She has no problem falling asleep. She goes to bed around 8:30-9 pm. She reports every night, she will wake up between 3-4 and be unable to go back to sleep. She reports she was retired but just went back to work full time around the same time she started having sleeping issues. She thinks the two must be related. She is not napping during the day. She denies feeling overwhelmed or stressed. She has not taken anything OTC for this.  Review of Systems      Past Medical History:  Diagnosis Date  . Allergy   . Basal cell carcinoma   . Chicken pox   . GERD (gastroesophageal reflux disease)   . Heart murmur   . History of blood transfusion   . History of stomach ulcers   . Hx of adenomatous polyp of colon 09/05/2017  . Post-operative nausea and vomiting    "slow to wake up"    Current Outpatient Medications  Medication Sig Dispense Refill  . cetirizine (ZYRTEC) 10 MG tablet Take 10 mg by mouth daily.    . Multiple Vitamins-Minerals (VISION FORMULA EYE HEALTH) CAPS Take 2 capsules by mouth daily.    . Multiple Vitamins-Minerals (WOMENS MULTIVITAMIN PO) Take 1 tablet by mouth daily.    Vladimir Faster Glycol-Propyl Glycol (SYSTANE OP) Apply 1 drop to eye 2 (two) times daily.      No current facility-administered medications for this visit.     Allergies  Allergen Reactions  . Phenobarbital Other (See Comments)    syncope  . Codeine Nausea And Vomiting    Family History  Problem Relation Age of Onset  . Arthritis Mother   . Uterine cancer Mother   . Diabetes Mother   . Heart disease Father   . Cancer Maternal Uncle   . Throat cancer Maternal Grandmother   . Diabetes Maternal Grandmother     . Prostate cancer Maternal Grandfather   . Colon cancer Neg Hx     Social History   Socioeconomic History  . Marital status: Divorced    Spouse name: Not on file  . Number of children: Not on file  . Years of education: Not on file  . Highest education level: Not on file  Social Needs  . Financial resource strain: Not on file  . Food insecurity - worry: Not on file  . Food insecurity - inability: Not on file  . Transportation needs - medical: Not on file  . Transportation needs - non-medical: Not on file  Occupational History  . Not on file  Tobacco Use  . Smoking status: Former Research scientist (life sciences)  . Smokeless tobacco: Never Used  Substance and Sexual Activity  . Alcohol use: No  . Drug use: No  . Sexual activity: Not Currently  Other Topics Concern  . Not on file  Social History Narrative  . Not on file     Constitutional: Pt reports fatigue. Denies fever, malaise, headache or abrupt weight changes.  Neurological: Pt reports insomnia. Denies dizziness, difficulty with memory, difficulty with speech or problems with balance and coordination.  Psych: Denies anxiety, depression, SI/HI.  No other specific complaints in a complete  review of systems (except as listed in HPI above).  Objective:   Physical Exam   BP 128/82   Pulse 78   Temp 98.2 F (36.8 C) (Oral)   Wt 189 lb (85.7 kg)   SpO2 98%   BMI 31.45 kg/m  Wt Readings from Last 3 Encounters:  02/26/18 189 lb (85.7 kg)  08/27/17 190 lb (86.2 kg)  08/13/17 190 lb (86.2 kg)    General: Appears her stated age, obese in NAD. Cardiovascular: Normal rate and rhythm.  Pulmonary/Chest: Normal effort and positive vesicular breath sounds. No respiratory distress. No wheezes, rales or ronchi noted.  Neurological: Alert and oriented.  Psychiatric: Mood and affect normal. Behavior is normal. Judgment and thought content normal.    BMET    Component Value Date/Time   NA 134 (L) 07/17/2017 1622   K 4.8 07/17/2017 1622    CL 99 07/17/2017 1622   CO2 27 07/17/2017 1622   GLUCOSE 86 07/17/2017 1622   BUN 18 07/17/2017 1622   CREATININE 0.70 07/17/2017 1622   CALCIUM 10.0 07/17/2017 1622    Lipid Panel     Component Value Date/Time   CHOL 242 (H) 07/17/2017 1622   TRIG 66.0 07/17/2017 1622   HDL 78.50 07/17/2017 1622   CHOLHDL 3 07/17/2017 1622   VLDL 13.2 07/17/2017 1622   LDLCALC 150 (H) 07/17/2017 1622    CBC    Component Value Date/Time   WBC 8.4 07/17/2017 1622   RBC 3.92 07/17/2017 1622   HGB 12.0 07/17/2017 1622   HCT 35.9 (L) 07/17/2017 1622   PLT 358.0 07/17/2017 1622   MCV 91.4 07/17/2017 1622   MCHC 33.4 07/17/2017 1622   RDW 14.5 07/17/2017 1622    Hgb A1C No results found for: HGBA1C         Assessment & Plan:   Fatigue, secondary to Insomnia:  Encouraged her to have a consistent bedtime routine Advised her to try walking for 30 minutes after dinner She will try Melatonin 5 mg QHS If no improvement, consider Trazadone  Return precautions discussed Webb Silversmith, NP

## 2018-03-09 DIAGNOSIS — H353222 Exudative age-related macular degeneration, left eye, with inactive choroidal neovascularization: Secondary | ICD-10-CM | POA: Diagnosis not present

## 2018-03-09 DIAGNOSIS — H5203 Hypermetropia, bilateral: Secondary | ICD-10-CM | POA: Diagnosis not present

## 2018-03-09 DIAGNOSIS — Z961 Presence of intraocular lens: Secondary | ICD-10-CM | POA: Diagnosis not present

## 2018-03-09 DIAGNOSIS — H353232 Exudative age-related macular degeneration, bilateral, with inactive choroidal neovascularization: Secondary | ICD-10-CM | POA: Diagnosis not present

## 2018-05-11 ENCOUNTER — Encounter: Payer: Self-pay | Admitting: Internal Medicine

## 2018-05-14 ENCOUNTER — Ambulatory Visit (INDEPENDENT_AMBULATORY_CARE_PROVIDER_SITE_OTHER): Payer: Medicare Other | Admitting: Internal Medicine

## 2018-05-14 ENCOUNTER — Encounter: Payer: Self-pay | Admitting: Internal Medicine

## 2018-05-14 VITALS — BP 126/74 | HR 80 | Temp 98.2°F | Wt 194.0 lb

## 2018-05-14 DIAGNOSIS — R140 Abdominal distension (gaseous): Secondary | ICD-10-CM | POA: Diagnosis not present

## 2018-05-14 DIAGNOSIS — R109 Unspecified abdominal pain: Secondary | ICD-10-CM | POA: Diagnosis not present

## 2018-05-14 DIAGNOSIS — R195 Other fecal abnormalities: Secondary | ICD-10-CM | POA: Diagnosis not present

## 2018-05-14 DIAGNOSIS — K5900 Constipation, unspecified: Secondary | ICD-10-CM

## 2018-05-14 LAB — COMPREHENSIVE METABOLIC PANEL
ALT: 14 U/L (ref 0–35)
AST: 15 U/L (ref 0–37)
Albumin: 4.1 g/dL (ref 3.5–5.2)
Alkaline Phosphatase: 59 U/L (ref 39–117)
BUN: 19 mg/dL (ref 6–23)
CO2: 29 mEq/L (ref 19–32)
Calcium: 9.7 mg/dL (ref 8.4–10.5)
Chloride: 99 mEq/L (ref 96–112)
Creatinine, Ser: 0.58 mg/dL (ref 0.40–1.20)
GFR: 110.41 mL/min (ref >60.00)
Glucose, Bld: 98 mg/dL (ref 70–99)
Potassium: 4.3 mEq/L (ref 3.5–5.1)
Sodium: 135 mEq/L (ref 135–145)
Total Bilirubin: 0.5 mg/dL (ref 0.2–1.2)
Total Protein: 7.2 g/dL (ref 6.0–8.3)

## 2018-05-14 LAB — CBC
HEMATOCRIT: 37.2 % (ref 36.0–46.0)
HEMOGLOBIN: 12.7 g/dL (ref 12.0–15.0)
MCHC: 34.1 g/dL (ref 30.0–36.0)
MCV: 90.3 fl (ref 78.0–100.0)
PLATELETS: 348 10*3/uL (ref 150.0–400.0)
RBC: 4.12 Mil/uL (ref 3.87–5.11)
RDW: 14.6 % (ref 11.5–15.5)
WBC: 6.4 10*3/uL (ref 4.0–10.5)

## 2018-05-14 LAB — H. PYLORI ANTIBODY, IGG: H Pylori IgG: NEGATIVE

## 2018-05-14 LAB — TSH: TSH: 2.11 u[IU]/mL (ref 0.35–4.50)

## 2018-05-14 NOTE — Progress Notes (Signed)
Subjective:    Patient ID: Terri Crawford, female    DOB: 11/22/1951, 67 y.o.   MRN: [id number]  HPI  Pt presents to the clinic today to discuss changes in bowel habits. She reports over the last 2-3 months months, she has been more constipated and then having more loose stools than usual. She only has a BM about 3 x week. She is having associated abdominal cramping, gas and bloating. She reports sometimes when she is trying to have a BM, she will get sweaty and nauseated. She denies vomiting or blood in her stool. She has not taken anything OTC for her symptoms. She has a history of GERD with stomach ulcer. She is not currently taking an H2 Blocker or PPI at this time. She had a colonoscopy 08/2017. Colonoscopy showed 1 sessile polyp, and diverticulosis. She denies recent changes in medication or diet. She denies recent increase in stress.  Review of Systems      Past Medical History:  Diagnosis Date  . Allergy   . Basal cell carcinoma   . Chicken pox   . GERD (gastroesophageal reflux disease)   . Heart murmur   . History of blood transfusion   . History of stomach ulcers   . Hx of adenomatous polyp of colon 09/05/2017  . Post-operative nausea and vomiting    "slow to wake up"    Current Outpatient Medications  Medication Sig Dispense Refill  . cetirizine (ZYRTEC) 10 MG tablet Take 10 mg by mouth daily.    . Multiple Vitamins-Minerals (VISION FORMULA EYE HEALTH) CAPS Take 2 capsules by mouth daily.    . Multiple Vitamins-Minerals (WOMENS MULTIVITAMIN PO) Take 1 tablet by mouth daily.    Vladimir Faster Glycol-Propyl Glycol (SYSTANE OP) Apply 1 drop to eye 2 (two) times daily.      No current facility-administered medications for this visit.     Allergies  Allergen Reactions  . Phenobarbital Other (See Comments)    syncope  . Codeine Nausea And Vomiting    Family History  Problem Relation Age of Onset  . Arthritis Mother   . Uterine cancer Mother   . Diabetes Mother     . Heart disease Father   . Cancer Maternal Uncle   . Throat cancer Maternal Grandmother   . Diabetes Maternal Grandmother   . Prostate cancer Maternal Grandfather   . Colon cancer Neg Hx     Social History   Socioeconomic History  . Marital status: Divorced    Spouse name: Not on file  . Number of children: Not on file  . Years of education: Not on file  . Highest education level: Not on file  Occupational History  . Not on file  Social Needs  . Financial resource strain: Not on file  . Food insecurity:    Worry: Not on file    Inability: Not on file  . Transportation needs:    Medical: Not on file    Non-medical: Not on file  Tobacco Use  . Smoking status: Former Research scientist (life sciences)  . Smokeless tobacco: Never Used  Substance and Sexual Activity  . Alcohol use: No  . Drug use: No  . Sexual activity: Not Currently  Lifestyle  . Physical activity:    Days per week: Not on file    Minutes per session: Not on file  . Stress: Not on file  Relationships  . Social connections:    Talks on phone: Not on file    Gets  together: Not on file    Attends religious service: Not on file    Active member of club or organization: Not on file    Attends meetings of clubs or organizations: Not on file    Relationship status: Not on file  . Intimate partner violence:    Fear of current or ex partner: Not on file    Emotionally abused: Not on file    Physically abused: Not on file    Forced sexual activity: Not on file  Other Topics Concern  . Not on file  Social History Narrative  . Not on file     Constitutional: Denies fever, malaise, fatigue, headache or abrupt weight changes.  Respiratory: Denies difficulty breathing, shortness of breath, cough or sputum production.   Cardiovascular: Denies chest pain, chest tightness, palpitations or swelling in the hands or feet.  Gastrointestinal: Pt reports abdominal cramping, nausea, bloating, loose stools and constipation. Denies blood in the  stool.  GU: Denies urgency, frequency, pain with urination, burning sensation, blood in urine, odor or discharge.   No other specific complaints in a complete review of systems (except as listed in HPI above).  Objective:   Physical Exam   BP 126/74   Pulse 80   Temp 98.2 F (36.8 C) (Oral)   Wt 194 lb (88 kg)   SpO2 98%   BMI 32.28 kg/m  Wt Readings from Last 3 Encounters:  05/14/18 194 lb (88 kg)  02/26/18 189 lb (85.7 kg)  08/27/17 190 lb (86.2 kg)    General: Appears her stated age, well developed, well nourished in NAD. Abdomen: Soft and mildly tender in the epigastric region. Normal bowel sounds. No distention or masses noted.  Neurological: Alert and oriented.   BMET    Component Value Date/Time   NA 134 (L) 07/17/2017 1622   K 4.8 07/17/2017 1622   CL 99 07/17/2017 1622   CO2 27 07/17/2017 1622   GLUCOSE 86 07/17/2017 1622   BUN 18 07/17/2017 1622   CREATININE 0.70 07/17/2017 1622   CALCIUM 10.0 07/17/2017 1622    Lipid Panel     Component Value Date/Time   CHOL 242 (H) 07/17/2017 1622   TRIG 66.0 07/17/2017 1622   HDL 78.50 07/17/2017 1622   CHOLHDL 3 07/17/2017 1622   VLDL 13.2 07/17/2017 1622   LDLCALC 150 (H) 07/17/2017 1622    CBC    Component Value Date/Time   WBC 8.4 07/17/2017 1622   RBC 3.92 07/17/2017 1622   HGB 12.0 07/17/2017 1622   HCT 35.9 (L) 07/17/2017 1622   PLT 358.0 07/17/2017 1622   MCV 91.4 07/17/2017 1622   MCHC 33.4 07/17/2017 1622   RDW 14.5 07/17/2017 1622    Hgb A1C No results found for: HGBA1C         Assessment & Plan:   Abdominal Cramping, Loose Stools, Constipation, Bloating:  Will check CBC, CMET, TSH and H Pylori today If labs negative, will refer to GI for further evaluation Encouraged her to try a probiotic or Activia yogurt daily  Will follow up after labs, return precautions discussed Webb Silversmith, NP

## 2018-05-14 NOTE — Patient Instructions (Signed)
Abdominal Bloating °When you have abdominal bloating, your abdomen may feel full, tight, or painful. It may also look bigger than normal or swollen (distended). Common causes of abdominal bloating include: °· Swallowing air. °· Constipation. °· Problems digesting food. °· Eating too much. °· Irritable bowel syndrome. This is a condition that affects the large intestine. °· Lactose intolerance. This is an inability to digest lactose, a natural sugar in dairy products. °· Celiac disease. This is a condition that affects the ability to digest gluten, a protein found in some grains. °· Gastroparesis. This is a condition that slows down the movement of food in the stomach and small intestine. It is more common in people with diabetes mellitus. °· Gastroesophageal reflux disease (GERD). This is a digestive condition that makes stomach acid flow back into the esophagus. °· Urinary retention. This means that the body is holding onto urine, and the bladder cannot be emptied all the way. ° °Follow these instructions at home: °Eating and drinking °· Avoid eating too much. °· Try not to swallow air while talking or eating. °· Avoid eating while lying down. °· Avoid these foods and drinks: °? Foods that cause gas, such as broccoli, cabbage, cauliflower, and baked beans. °? Carbonated drinks. °? Hard candy. °? Chewing gum. °Medicines °· Take over-the-counter and prescription medicines only as told by your health care provider. °· Take probiotic medicines. These medicines contain live bacteria or yeasts that can help digestion. °· Take coated peppermint oil capsules. °Activity °· Try to exercise regularly. Exercise may help to relieve bloating that is caused by gas and relieve constipation. °General instructions °· Keep all follow-up visits as told by your health care provider. This is important. °Contact a health care provider if: °· You have nausea and vomiting. °· You have diarrhea. °· You have abdominal pain. °· You have  unusual weight loss or weight gain. °· You have severe pain, and medicines do not help. °Get help right away if: °· You have severe chest pain. °· You have trouble breathing. °· You have shortness of breath. °· You have trouble urinating. °· You have darker urine than normal. °· You have blood in your stools or have dark, tarry stools. °Summary °· Abdominal bloating means that the abdomen is swollen. °· Common causes of abdominal bloating are swallowing air, constipation, and problems digesting food. °· Avoid eating too much and avoid swallowing air. °· Avoid foods that cause gas, carbonated drinks, hard candy, and chewing gum. °This information is not intended to replace advice given to you by your health care provider. Make sure you discuss any questions you have with your health care provider. °Document Released: 01/10/2017 Document Revised: 01/10/2017 Document Reviewed: 01/10/2017 °Elsevier Interactive Patient Education © 2018 [employer] ° °

## 2018-05-14 NOTE — Addendum Note (Signed)
Addended by: Jearld Fenton on: 05/14/2018 02:19 PM   Modules accepted: Orders

## 2018-05-20 ENCOUNTER — Telehealth: Payer: Self-pay | Admitting: Internal Medicine

## 2018-05-20 NOTE — Telephone Encounter (Signed)
msg sent via my chart

## 2018-05-20 NOTE — Telephone Encounter (Signed)
Called patient and made her a GI Appt with [facility] but appt is 2 weeks out. Patient is wondering what she can do for the next two weeks as far as what foods to stay away from etc to help with her symptoms that she is having. You can send her a Pharmacist, community message with your recommendations.

## 2018-05-20 NOTE — Telephone Encounter (Signed)
Have her google or print out info on FODMAP diet

## 2018-05-29 ENCOUNTER — Ambulatory Visit (INDEPENDENT_AMBULATORY_CARE_PROVIDER_SITE_OTHER)
Admission: RE | Admit: 2018-05-29 | Discharge: 2018-05-29 | Disposition: A | Discharge status: Home / Self Care | Payer: Medicare Other | Source: Ambulatory Visit | Attending: Family Medicine | Admitting: Family Medicine

## 2018-05-29 ENCOUNTER — Ambulatory Visit (INDEPENDENT_AMBULATORY_CARE_PROVIDER_SITE_OTHER): Payer: Medicare Other | Admitting: Family Medicine

## 2018-05-29 ENCOUNTER — Encounter: Payer: Self-pay | Admitting: Family Medicine

## 2018-05-29 ENCOUNTER — Telehealth: Payer: Self-pay | Admitting: Family Medicine

## 2018-05-29 VITALS — BP 140/78 | HR 83 | Temp 98.3°F | Ht 65.0 in | Wt 196.4 lb

## 2018-05-29 DIAGNOSIS — M25562 Pain in left knee: Secondary | ICD-10-CM

## 2018-05-29 DIAGNOSIS — W19XXXA Unspecified fall, initial encounter: Secondary | ICD-10-CM

## 2018-05-29 DIAGNOSIS — S8992XA Unspecified injury of left lower leg, initial encounter: Secondary | ICD-10-CM | POA: Diagnosis not present

## 2018-05-29 MED ORDER — TRAMADOL HCL 50 MG PO TABS: 50.0000 mg | ORAL_TABLET | Freq: Three times a day (TID) | ORAL | 0 refills | Status: DC | PRN

## 2018-05-29 NOTE — Patient Instructions (Addendum)
Recommend tramadol  as needed for pain. Wear pull on  knee brace. Start home PT. Ice and elevate.

## 2018-05-29 NOTE — Telephone Encounter (Unsigned)
Copied from [place] [id number]. Topic: Quick Communication - See Telephone Encounter >> May 29, 2018 10:47 AM Neva Seat wrote: Pt went to [facility] this morning after visit w/ Dr. Diona Browner to pick up her traMADol (ULTRAM) 50 MG tablet. It hasn't been called in.  Pt is needing this taken care of to pick up today please.

## 2018-05-29 NOTE — Assessment & Plan Note (Signed)
X-ray  Negative on my read for fracture.  Exam does not suggest internal knee derangemnt.  Most likely soft tissue injury " bone Bruise"  Recommend tramadol  For pain given  Current stomach issue possibly to  Worsen with NSAIDs ( has upcoming GI eval)  Wear knee brace. S tart home PT.  ICe and elevate.

## 2018-05-29 NOTE — Progress Notes (Signed)
   Subjective:    Patient ID: Terri Crawford, female    DOB: October 09, 1951, 67 y.o.   MRN: [id number]  HPI 67 year old female presents following a fall last night.  She was wearing slick shoes and lost balance on tile. ( NO proceeding dizziness, CP, SOB) Landed on left knee.  Intially was able to weight bear.. Pain progressively increased. Pain with weihgt bearing. She can straighten and bend but painful.  No LOC or head injury.   No other sore areas.   She has been elevating and icing knee, use aspirin for pain.  No past injury to knee, no past knee surgery.  Blood pressure 140/78, pulse 83, temperature 98.3 F (36.8 C), temperature source Oral, height 5\' 5"  (1.651 m), weight 196 lb 6.4 oz (89.1 kg), SpO2 96 %.   Review of Systems  Constitutional: Negative for fatigue and fever.  HENT: Negative for ear pain.   Eyes: Negative for pain.  Respiratory: Negative for chest tightness and shortness of breath.   Cardiovascular: Negative for chest pain, palpitations and leg swelling.  Gastrointestinal: Negative for abdominal pain.  Genitourinary: Negative for dysuria.       Objective:   Physical Exam  Constitutional: Vital signs are normal. She appears well-developed and well-nourished. She is cooperative.  Non-toxic appearance. She does not appear ill. No distress.  HENT:  Head: Normocephalic.  Right Ear: Hearing, tympanic membrane, external ear and ear canal normal. Tympanic membrane is not erythematous, not retracted and not bulging.  Left Ear: Hearing, tympanic membrane, external ear and ear canal normal. Tympanic membrane is not erythematous, not retracted and not bulging.  Nose: No mucosal edema or rhinorrhea. Right sinus exhibits no maxillary sinus tenderness and no frontal sinus tenderness. Left sinus exhibits no maxillary sinus tenderness and no frontal sinus tenderness.  Mouth/Throat: Uvula is midline, oropharynx is clear and moist and mucous membranes are normal.  Eyes:  Pupils are equal, round, and reactive to light. Conjunctivae, EOM and lids are normal. Lids are everted and swept, no foreign bodies found.  Neck: Trachea normal and normal range of motion. Neck supple. Carotid bruit is not present. No thyroid mass and no thyromegaly present.  Cardiovascular: Normal rate, regular rhythm, S1 normal, S2 normal, normal heart sounds, intact distal pulses and normal pulses. Exam reveals no gallop and no friction rub.  No murmur heard. Pulmonary/Chest: Effort normal and breath sounds normal. No tachypnea. No respiratory distress. She has no decreased breath sounds. She has no wheezes. She has no rhonchi. She has no rales.  Abdominal: Soft. Normal appearance and bowel sounds are normal. There is no tenderness.  Musculoskeletal:       Right shoulder: She exhibits decreased range of motion, tenderness, bony tenderness and pain. She exhibits no swelling and no effusion.  Decreased ROM of left knee  ttp and contusion over inferior aspect of knee... No ttp at joint line  neg Mc[place]rays, ACL and PCL intact.  Neurological: She is alert.  Skin: Skin is warm, dry and intact. No rash noted.  Psychiatric: Her speech is normal and behavior is normal. Judgment and thought content normal. Her mood appears not anxious. Cognition and memory are normal. She does not exhibit a depressed mood.          Assessment & Plan:

## 2018-05-29 NOTE — Telephone Encounter (Signed)
Pt has already picked up/thx dmf

## 2018-06-04 ENCOUNTER — Encounter: Payer: Self-pay | Admitting: Physician Assistant

## 2018-06-04 ENCOUNTER — Encounter

## 2018-06-04 ENCOUNTER — Ambulatory Visit (INDEPENDENT_AMBULATORY_CARE_PROVIDER_SITE_OTHER): Payer: Medicare Other | Admitting: Physician Assistant

## 2018-06-04 ENCOUNTER — Other Ambulatory Visit (INDEPENDENT_AMBULATORY_CARE_PROVIDER_SITE_OTHER): Payer: Medicare Other

## 2018-06-04 VITALS — BP 150/80 | HR 76 | Ht 64.5 in | Wt 196.0 lb

## 2018-06-04 DIAGNOSIS — R140 Abdominal distension (gaseous): Secondary | ICD-10-CM

## 2018-06-04 DIAGNOSIS — R1030 Lower abdominal pain, unspecified: Secondary | ICD-10-CM | POA: Diagnosis not present

## 2018-06-04 DIAGNOSIS — R1032 Left lower quadrant pain: Secondary | ICD-10-CM | POA: Diagnosis not present

## 2018-06-04 DIAGNOSIS — R109 Unspecified abdominal pain: Secondary | ICD-10-CM

## 2018-06-04 LAB — SEDIMENTATION RATE: SED RATE: 42 mm/h — AB (ref 0–30)

## 2018-06-04 MED ORDER — GLYCOPYRROLATE 2 MG PO TABS: 2.0000 mg | ORAL_TABLET | Freq: Two times a day (BID) | ORAL | 6 refills | Status: DC

## 2018-06-04 NOTE — Progress Notes (Addendum)
Subjective:    Patient ID: Terri Crawford, female    DOB: Jun 14, 1951, 67 y.o.   MRN: [id number]  HPI Terri Crawford is a pleasant 67 year old white female, known to Dr. Carlean Purl from colonoscopy who is referred back today by Golden Hurter, NP for evaluation of abdominal pain cramping and change in bowel habits. Patient had screening colonoscopy done in September 2018 with finding of one 5 mm sessile polyp in the transverse colon which was a tubular adenoma and multiple diverticuli in the sigmoid colon.  She is indicated for 5-year interval follow-up. She states that she has had some mild IBS type symptoms in the past but over the past couple of months has been having significant problems that are different from anything that she is had in the past.  She has developed irregular bowel movements, lower abdominal pain and cramping which she says feels like trapped gas at times and is frequently waking her up at night.  She feels bloated and distended at times and says sometimes the pain gets bad enough that she is actually nauseated.   She has  not had any fever or chills.  Has had pain both in the left and the right side but primarily on the left.  She is been alternating between episodes of diarrhea and constipation which may last for 2 or 3 days. She has not had any recent change in medications vitamins or supplements or her diet. No prior abdominal surgery.  Appetite has  been fine and weight has been stable. She does feel she is lactose intolerant and generally avoids lactose, uses soy milk.  She does not drink any carbonated beverages and does not use artificial sweeteners.  Review of Systems Pertinent positive and negative review of systems were noted in the above HPI section.  All other review of systems was otherwise negative.  Outpatient Encounter Medications as of 06/04/2018  Medication Sig  . cetirizine (ZYRTEC) 10 MG tablet Take 10 mg by mouth daily.  . Multiple Vitamins-Minerals (VISION  FORMULA EYE HEALTH) CAPS Take 2 capsules by mouth daily.  . Multiple Vitamins-Minerals (WOMENS MULTIVITAMIN PO) Take 1 tablet by mouth daily.  Vladimir Faster Glycol-Propyl Glycol (SYSTANE OP) Apply 1 drop to eye 2 (two) times daily.   . traMADol (ULTRAM) 50 MG tablet Take 1 tablet (50 mg total) by mouth every 8 (eight) hours as needed.  Marland Kitchen glycopyrrolate (ROBINUL) 2 MG tablet Take 1 tablet (2 mg total) by mouth 2 (two) times daily.   No facility-administered encounter medications on file as of 06/04/2018.    Allergies  Allergen Reactions  . Phenobarbital Other (See Comments)    syncope  . Codeine Nausea And Vomiting   Patient Active Problem List   Diagnosis Date Noted  . Acute pain of left knee 05/29/2018   Social History   Socioeconomic History  . Marital status: Divorced    Spouse name: Not on file  . Number of children: Not on file  . Years of education: Not on file  . Highest education level: Not on file  Occupational History  . Not on file  Social Needs  . Financial resource strain: Not on file  . Food insecurity:    Worry: Not on file    Inability: Not on file  . Transportation needs:    Medical: Not on file    Non-medical: Not on file  Tobacco Use  . Smoking status: Former Research scientist (life sciences)  . Smokeless tobacco: Never Used  Substance and Sexual Activity  .  Alcohol use: No  . Drug use: No  . Sexual activity: Not Currently  Lifestyle  . Physical activity:    Days per week: Not on file    Minutes per session: Not on file  . Stress: Not on file  Relationships  . Social connections:    Talks on phone: Not on file    Gets together: Not on file    Attends religious service: Not on file    Active member of club or organization: Not on file    Attends meetings of clubs or organizations: Not on file    Relationship status: Not on file  . Intimate partner violence:    Fear of current or ex partner: Not on file    Emotionally abused: Not on file    Physically abused: Not on file     Forced sexual activity: Not on file  Other Topics Concern  . Not on file  Social History Narrative  . Not on file    Ms. Timm's family history includes Arthritis in her mother; Cancer in her maternal uncle; Diabetes in her maternal grandmother and mother; Heart disease in her father; Prostate cancer in her maternal grandfather; Throat cancer in her maternal grandmother; Uterine cancer in her mother.      Objective:    Vitals:   06/04/18 1355  BP: (!) 150/80  Pulse: 76    Physical Exam; well-developed older white female in no acute distress, pleasant blood pressure 150/80 pulse 76, height 5 foot 4, weight 196, BMI 33.1.  HEENT; nontraumatic normocephalic EOMI PERRLA sclera anicteric, Oropharynx clear, Cardiovascular; regular rate and rhythm with S1-S2 no murmur or gallop, Pulmonary ;clear bilaterally, Abdomen ;soft, she has some tenderness bilaterally in the lower quadrants, and in the mid abdomen there is no palpable mass or hepatosplenomegaly bowel sounds are present.  Rectal; exam not done.  Extremities ;no clubbing cyanosis or edema skin warm and dry, Neuro psych; alert and oriented, grossly nonfocal mood and affect appropriate       Assessment & Plan:   #29 67 year old white female with 83-monthhistory of ongoing lower abdominal discomfort cramping bloating gas and change in bowel habits with alternating diarrhea and constipation.  Patient describes the pain as significant at times to the point of causing nausea and is at times waking her from sleep.  Etiology of symptoms is not clear, I am not convinced that current symptoms are IBS as she has had new onset within the past couple of months. Rule out smoldering diverticulitis, rule out other intra-abdominal inflammatory process/neoplasm  #2 diverticulosis #3 history of adenomatous colon polyp-up-to-date with colonoscopy last done September 2018, due for interval follow-up 2023  Plan; Recent CBC and C met reviewed and  unremarkable, check sed rate Schedule for CT of the abdomen and pelvis with contrast Start Benefiber 1 scoop daily and large glass of water.  Patient asked to increase her water intake to about 60 ounces per day to help with constipation Start trial of Robinul Forte 2 mg 1-2 times daily for cramping discomfort and bloating. Low gas diet Further plans pending results of CT.  Amy SGenia HaroldPA-C 06/04/2018   Cc: BJearld Fenton NP  Agree with Ms. EGenia Haroldassessment and plan.  CT scan was ok We are seeing how she does with glycopyrrolate CGatha Mayer MD, [facility]

## 2018-06-04 NOTE — Patient Instructions (Signed)
You have been given a low gas diet to follow.   Start Benefiber one dose daily in a large glass of water.   We have sent the following medications to your pharmacy for you to pick up at your convenience: glycopyrrolate.  You have been scheduled for a CT scan of the abdomen and pelvis at [facility] ([address]---this is in the same building as [facility]).   You are scheduled on 06/11/18 at 3:00pm. You should arrive 15 minutes prior to your appointment time for registration. Please follow the written instructions below on the day of your exam:  WARNING: IF YOU ARE ALLERGIC TO IODINE/X-RAY DYE, PLEASE NOTIFY RADIOLOGY IMMEDIATELY AT [phone]! YOU WILL BE GIVEN A 13 HOUR PREMEDICATION PREP.  1) Do not eat or drink anything after 11:00am (4 hours prior to your test) 2) You have been given 2 bottles of oral contrast to drink. The solution may taste better if refrigerated, but do NOT add ice or any other liquid to this solution. Shake well before drinking.    Drink 1 bottle of contrast @ 1:00pm (2 hours prior to your exam)  Drink 1 bottle of contrast @ 2:00pm (1 hour prior to your exam)  You may take any medications as prescribed with a small amount of water except for the following: Metformin, Glucophage, Glucovance, Avandamet, Riomet, Fortamet, Actoplus Met, Janumet, Glumetza or Metaglip. The above medications must be held the day of the exam AND 48 hours after the exam.  The purpose of you drinking the oral contrast is to aid in the visualization of your intestinal tract. The contrast solution may cause some diarrhea. Before your exam is started, you will be given a small amount of fluid to drink. Depending on your individual set of symptoms, you may also receive an intravenous injection of x-ray contrast/dye. Plan on being at [facility] for 30 minutes or longer, depending on the type of exam you are having performed.  This test typically takes 30-45 minutes  to complete.  If you have any questions regarding your exam or if you need to reschedule, you may call the CT department at [phone] between the hours of 8:00 am and 5:00 pm, Monday-Friday.  ________________________________________________________________________

## 2018-06-11 ENCOUNTER — Ambulatory Visit (INDEPENDENT_AMBULATORY_CARE_PROVIDER_SITE_OTHER)
Admission: RE | Admit: 2018-06-11 | Discharge: 2018-06-11 | Disposition: A | Discharge status: Home / Self Care | Payer: Medicare Other | Source: Ambulatory Visit | Attending: Physician Assistant | Admitting: Physician Assistant

## 2018-06-11 DIAGNOSIS — R1032 Left lower quadrant pain: Secondary | ICD-10-CM | POA: Diagnosis not present

## 2018-06-11 DIAGNOSIS — K429 Umbilical hernia without obstruction or gangrene: Secondary | ICD-10-CM | POA: Diagnosis not present

## 2018-06-11 DIAGNOSIS — K5730 Diverticulosis of large intestine without perforation or abscess without bleeding: Secondary | ICD-10-CM | POA: Diagnosis not present

## 2018-06-11 DIAGNOSIS — R1030 Lower abdominal pain, unspecified: Secondary | ICD-10-CM

## 2018-06-11 DIAGNOSIS — R140 Abdominal distension (gaseous): Secondary | ICD-10-CM

## 2018-06-11 DIAGNOSIS — R109 Unspecified abdominal pain: Secondary | ICD-10-CM | POA: Diagnosis not present

## 2018-06-11 MED ORDER — IOPAMIDOL (ISOVUE-300) INJECTION 61%
100.0000 mL | Freq: Once | INTRAVENOUS | Status: AC | PRN
  Administered 2018-06-11: 100 mL via INTRAVENOUS

## 2018-06-23 ENCOUNTER — Encounter: Payer: Self-pay | Admitting: Internal Medicine

## 2018-06-28 ENCOUNTER — Encounter (INDEPENDENT_AMBULATORY_CARE_PROVIDER_SITE_OTHER): Payer: Self-pay

## 2018-12-02 DIAGNOSIS — H35433 Paving stone degeneration of retina, bilateral: Secondary | ICD-10-CM | POA: Diagnosis not present

## 2018-12-02 DIAGNOSIS — H15833 Staphyloma posticum, bilateral: Secondary | ICD-10-CM | POA: Diagnosis not present

## 2018-12-02 DIAGNOSIS — H353232 Exudative age-related macular degeneration, bilateral, with inactive choroidal neovascularization: Secondary | ICD-10-CM | POA: Diagnosis not present

## 2018-12-02 DIAGNOSIS — H442E3 Degenerative myopia with other maculopathy, bilateral eye: Secondary | ICD-10-CM | POA: Diagnosis not present

## 2019-01-17 IMAGING — DX DG KNEE COMPLETE 4+V*L*
5 series · 5 of 5 positions shown · non-contrast
Comparison: None.

CLINICAL DATA: Recent fall with anterior knee pain, initial
encounter

EXAM:
LEFT KNEE - COMPLETE 4+ VIEW

[knee ap]
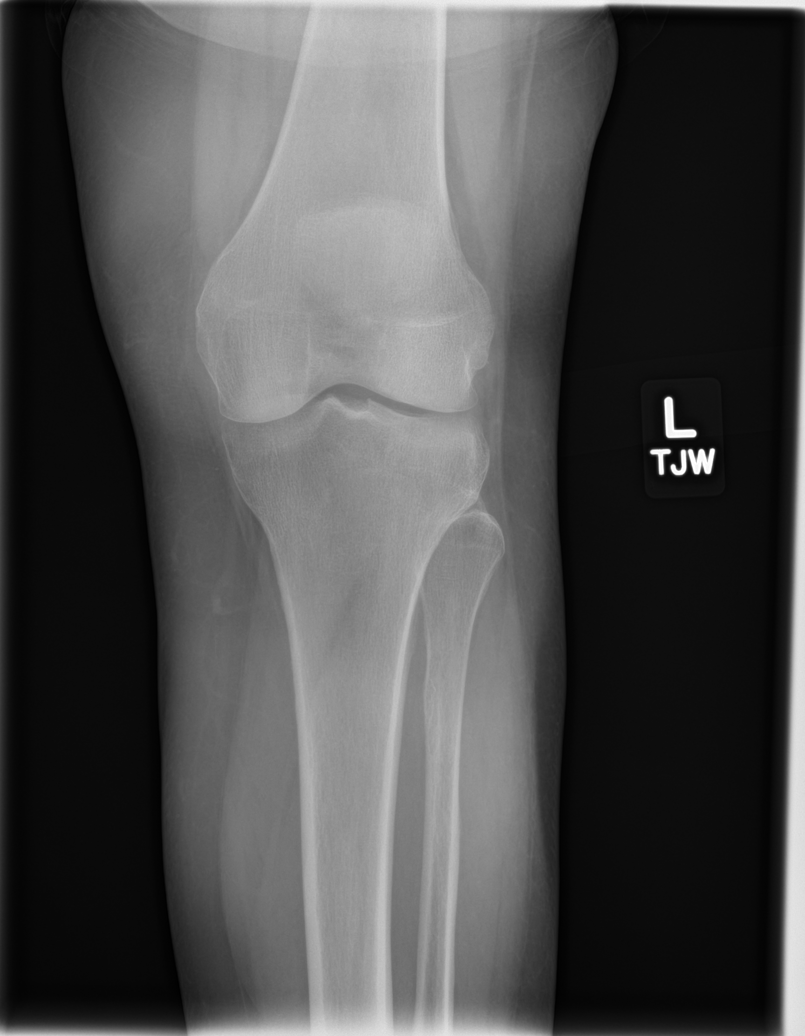

[knee lat]
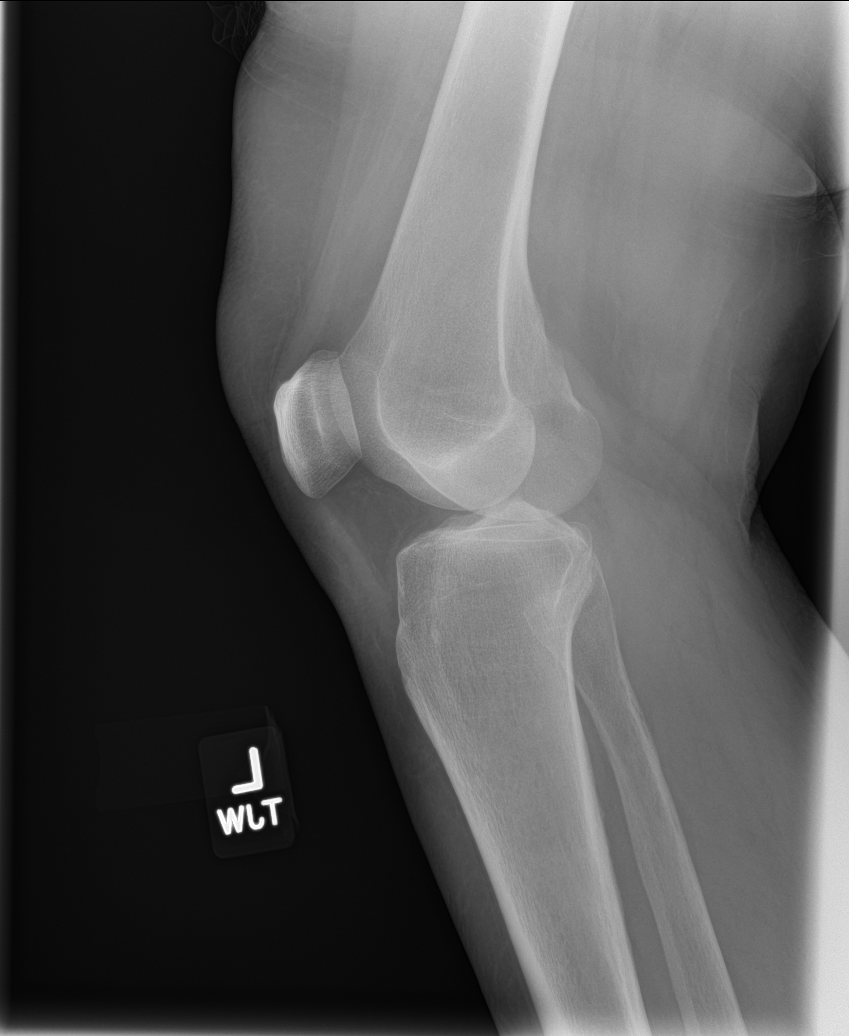

[patella skyline]
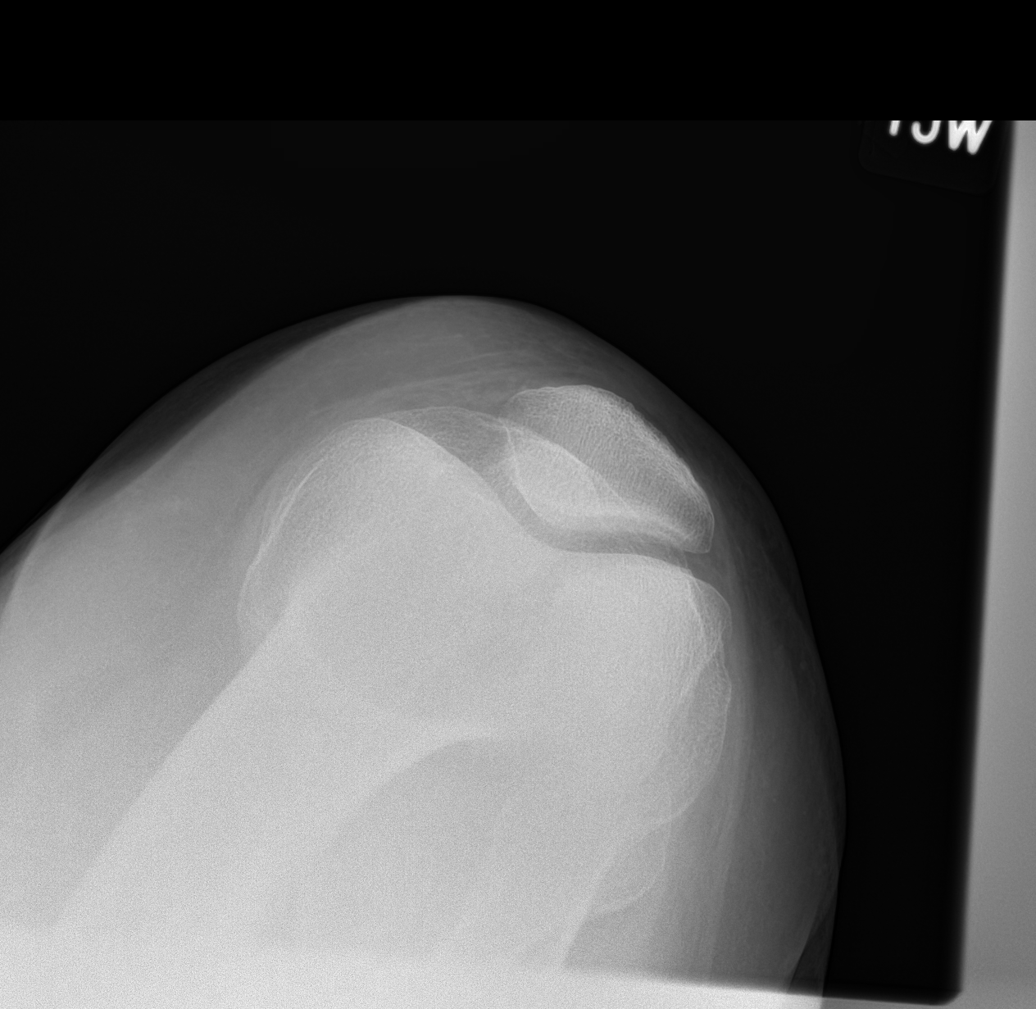

[knee obl (1 of 2)]
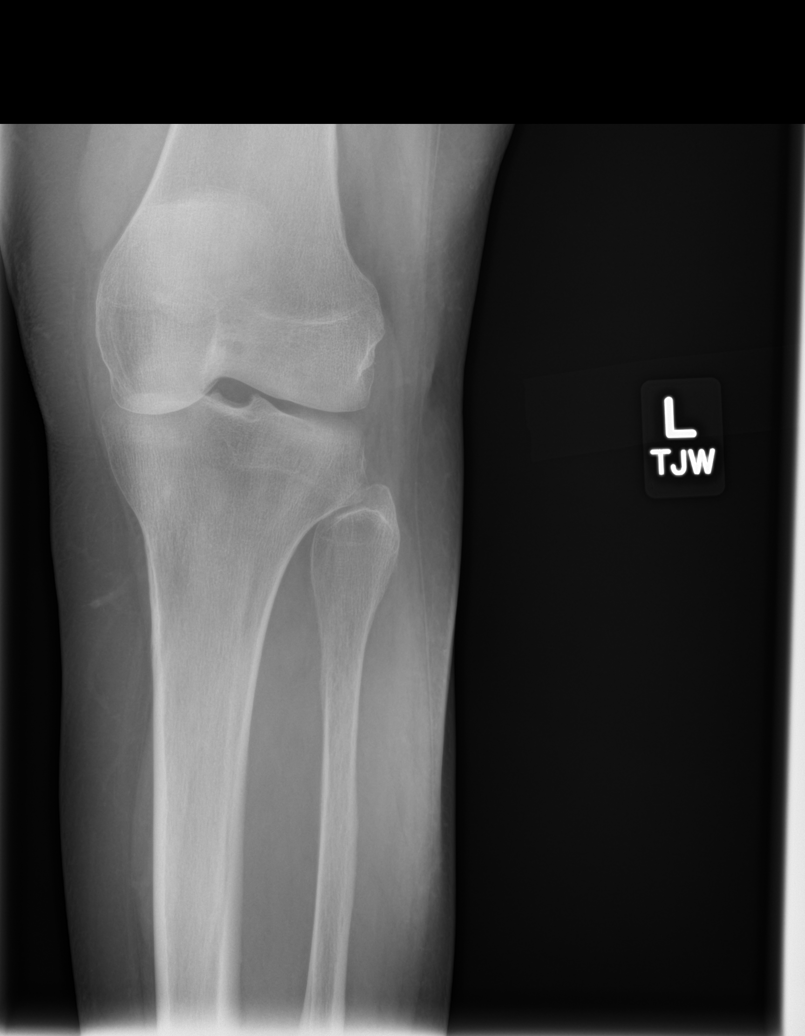

[knee obl (2 of 2)]
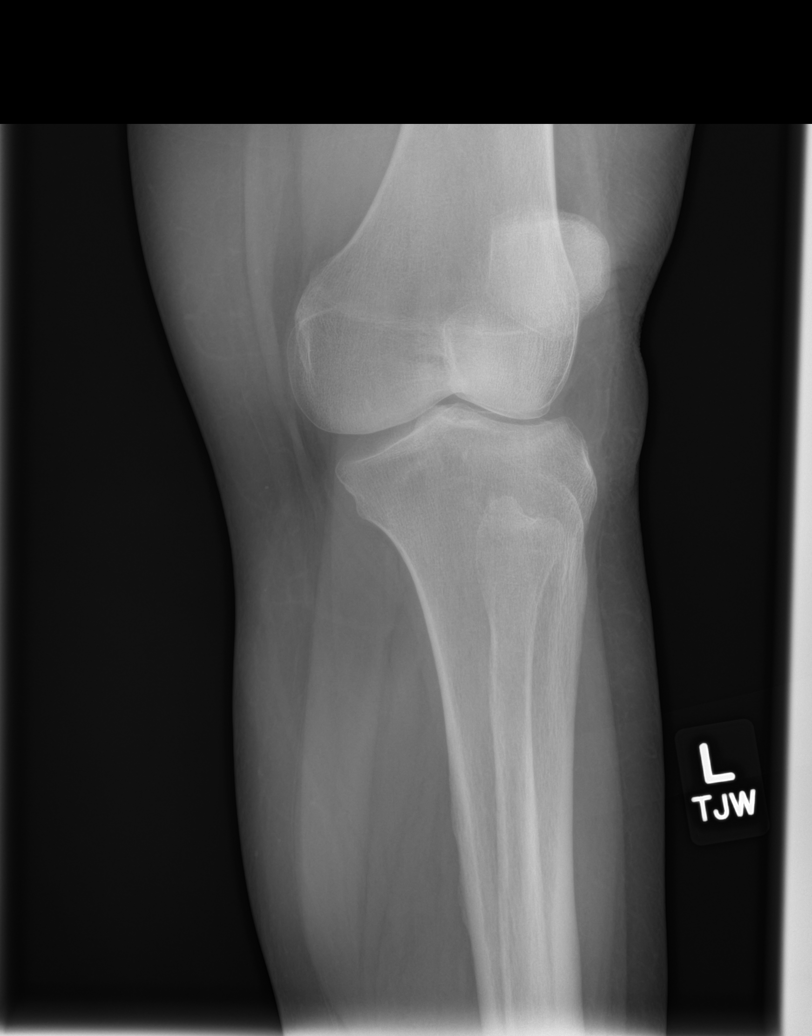

[5 of 5 positions shown; findings below may reference images not displayed]

FINDINGS: No evidence of fracture, dislocation, or joint effusion. No evidence
of arthropathy or other focal bone abnormality. Soft tissues are
unremarkable.
IMPRESSION: No acute abnormality noted.

## 2019-07-23 ENCOUNTER — Other Ambulatory Visit: Payer: Self-pay

## 2019-07-29 ENCOUNTER — Ambulatory Visit (INDEPENDENT_AMBULATORY_CARE_PROVIDER_SITE_OTHER): Payer: Medicare Other | Admitting: Internal Medicine

## 2019-07-29 ENCOUNTER — Encounter: Payer: Self-pay | Admitting: Internal Medicine

## 2019-07-29 ENCOUNTER — Other Ambulatory Visit: Payer: Self-pay

## 2019-07-29 VITALS — BP 130/76 | HR 83 | Temp 97.9°F | Wt 213.0 lb

## 2019-07-29 DIAGNOSIS — R109 Unspecified abdominal pain: Secondary | ICD-10-CM | POA: Diagnosis not present

## 2019-07-29 DIAGNOSIS — K5900 Constipation, unspecified: Secondary | ICD-10-CM

## 2019-07-29 DIAGNOSIS — K2100 Gastro-esophageal reflux disease with esophagitis, without bleeding: Secondary | ICD-10-CM

## 2019-07-29 DIAGNOSIS — F320 Major depressive disorder, single episode, mild: Secondary | ICD-10-CM | POA: Diagnosis not present

## 2019-07-29 DIAGNOSIS — F5104 Psychophysiologic insomnia: Secondary | ICD-10-CM

## 2019-07-29 DIAGNOSIS — R5383 Other fatigue: Secondary | ICD-10-CM | POA: Diagnosis not present

## 2019-07-29 DIAGNOSIS — R635 Abnormal weight gain: Secondary | ICD-10-CM

## 2019-07-29 DIAGNOSIS — J029 Acute pharyngitis, unspecified: Secondary | ICD-10-CM

## 2019-07-29 LAB — H. PYLORI ANTIBODY, IGG: H Pylori IgG: NEGATIVE

## 2019-07-29 LAB — COMPREHENSIVE METABOLIC PANEL
ALT: 16 U/L (ref 0–35)
AST: 17 U/L (ref 0–37)
Albumin: 4.4 g/dL (ref 3.5–5.2)
Alkaline Phosphatase: 57 U/L (ref 39–117)
BUN: 14 mg/dL (ref 6–23)
CO2: 30 mEq/L (ref 19–32)
Calcium: 10.5 mg/dL (ref 8.4–10.5)
Chloride: 94 mEq/L — ABNORMAL LOW (ref 96–112)
Creatinine, Ser: 0.67 mg/dL (ref 0.40–1.20)
GFR: 87.63 mL/min (ref >60.00)
Glucose, Bld: 105 mg/dL — ABNORMAL HIGH (ref 70–99)
Potassium: 4.7 mEq/L (ref 3.5–5.1)
Sodium: 132 mEq/L — ABNORMAL LOW (ref 135–145)
Total Bilirubin: 0.5 mg/dL (ref 0.2–1.2)
Total Protein: 7.4 g/dL (ref 6.0–8.3)

## 2019-07-29 LAB — CBC
HCT: 38.1 % (ref 36.0–46.0)
Hemoglobin: 12.7 g/dL (ref 12.0–15.0)
MCHC: 33.3 g/dL (ref 30.0–36.0)
MCV: 89.6 fl (ref 78.0–100.0)
Platelets: 370 10*3/uL (ref 150.0–400.0)
RBC: 4.25 Mil/uL (ref 3.87–5.11)
RDW: 14.8 % (ref 11.5–15.5)
WBC: 7.2 10*3/uL (ref 4.0–10.5)

## 2019-07-29 LAB — TSH: TSH: 1.83 u[IU]/mL (ref 0.35–4.50)

## 2019-07-29 MED ORDER — SERTRALINE HCL 50 MG PO TABS: ORAL_TABLET | ORAL | 5 refills | Status: DC

## 2019-07-29 MED ORDER — OMEPRAZOLE 20 MG PO CPDR: 20.0000 mg | DELAYED_RELEASE_CAPSULE | Freq: Every day | ORAL | 5 refills | Status: DC

## 2019-07-29 NOTE — Patient Instructions (Signed)

## 2019-07-29 NOTE — Progress Notes (Signed)
Subjective:    Patient ID: Terri Crawford, female    DOB: Oct 16, 1951, 68 y.o.   MRN: [id number]  HPI  Pt presents to the clinic today with multiple complaints.  She is concerned about weight gain. She has gained 17 lbs in the last year. She is not as active, not as eating well since she lost her job in March due to [place]. She is feeling down and depressed and is not sure if this is related. She is not currently seeing a therapist. She denies anxiety, SI/HI.  She also reports fatigue. She feels like this is caused by lack of sleep. She is having trouble falling asleep and staying asleep. She has tried an OTC sleep aid with minimal relief.   She c/o burning sensation in throat, cough, abdominal cramping and reflux. This started over a year ago. It does wake her up in the middle of the night. She is not sure what is triggering this. She feels like she is having trouble swallowing her food. The cough is not productive. She has occasional constipation but no diarrhea. She has tried Tums with minimal relief.   Review of Systems      Past Medical History:  Diagnosis Date  . Allergy   . Basal cell carcinoma   . Chicken pox   . GERD (gastroesophageal reflux disease)   . Heart murmur   . History of blood transfusion   . History of stomach ulcers   . Hx of adenomatous polyp of colon 09/05/2017  . Post-operative nausea and vomiting    "slow to wake up"    Current Outpatient Medications  Medication Sig Dispense Refill  . cetirizine (ZYRTEC) 10 MG tablet Take 10 mg by mouth daily.    Marland Kitchen glycopyrrolate (ROBINUL) 2 MG tablet Take 1 tablet (2 mg total) by mouth 2 (two) times daily. 60 tablet 6  . Multiple Vitamins-Minerals (VISION FORMULA EYE HEALTH) CAPS Take 2 capsules by mouth daily.    . Multiple Vitamins-Minerals (WOMENS MULTIVITAMIN PO) Take 1 tablet by mouth daily.    Vladimir Faster Glycol-Propyl Glycol (SYSTANE OP) Apply 1 drop to eye 2 (two) times daily.     . traMADol (ULTRAM) 50 MG  tablet Take 1 tablet (50 mg total) by mouth every 8 (eight) hours as needed. 15 tablet 0   No current facility-administered medications for this visit.     Allergies  Allergen Reactions  . Phenobarbital Other (See Comments)    syncope  . Codeine Nausea And Vomiting    Family History  Problem Relation Age of Onset  . Arthritis Mother   . Uterine cancer Mother   . Diabetes Mother   . Heart disease Father   . Cancer Maternal Uncle   . Throat cancer Maternal Grandmother   . Diabetes Maternal Grandmother   . Prostate cancer Maternal Grandfather   . Colon cancer Neg Hx     Social History   Socioeconomic History  . Marital status: Divorced    Spouse name: Not on file  . Number of children: Not on file  . Years of education: Not on file  . Highest education level: Not on file  Occupational History  . Not on file  Social Needs  . Financial resource strain: Not on file  . Food insecurity    Worry: Not on file    Inability: Not on file  . Transportation needs    Medical: Not on file    Non-medical: Not on file  Tobacco  Use  . Smoking status: Former Smoker  . Smokeless tobacco: Never Used  Substance and Sexual Activity  . Alcohol use: No  . Drug use: No  . Sexual activity: Not Currently  Lifestyle  . Physical activity    Days per week: Not on file    Minutes per session: Not on file  . Stress: Not on file  Relationships  . Social Herbalist on phone: Not on file    Gets together: Not on file    Attends religious service: Not on file    Active member of club or organization: Not on file    Attends meetings of clubs or organizations: Not on file    Relationship status: Not on file  . Intimate partner violence    Fear of current or ex partner: Not on file    Emotionally abused: Not on file    Physically abused: Not on file    Forced sexual activity: Not on file  Other Topics Concern  . Not on file  Social History Narrative  . Not on file      Constitutional: Pt reports fatigue and weight gain. Denies fever, malaise, headache.  Respiratory: Denies difficulty breathing, shortness of breath, cough or sputum production.   Cardiovascular: Denies chest pain, chest tightness, palpitations or swelling in the hands or feet.  Gastrointestinal: Pt reports abdominal cramping, reflux, intermittent constipation. Denies bloating,  diarrhea or blood in the stool.  Neurological: Pt reports insomnia. Denies dizziness, difficulty with memory, difficulty with speech or problems with balance and coordination.  Psych: Pt reports depression. Denies anxiety,  SI/HI.  No other specific complaints in a complete review of systems (except as listed in HPI above).  Objective:   Physical Exam  BP 130/76   Pulse 83   Temp 97.9 F (36.6 C) (Temporal)   Wt 213 lb (96.6 kg)   SpO2 98%   BMI 36.00 kg/m  Wt Readings from Last 3 Encounters:  07/29/19 213 lb (96.6 kg)  06/04/18 196 lb (88.9 kg)  05/29/18 196 lb 6.4 oz (89.1 kg)    General: Appears her stated age, obese in NAD. Skin: Warm, dry and intact. No rashes noted. HEENT: Head: normal shape and size;  Neck:  No adenopathy or thyromegaly noted. Cardiovascular: Normal rate and rhythm. S1,S2 noted.  No murmur, rubs or gallops noted. Pulmonary/Chest: Normal effort and positive vesicular breath sounds. No respiratory distress. No wheezes, rales or ronchi noted.  Abdomen: Soft and nontender. Normal bowel sounds. No distention or masses noted.  Neurological: Alert and oriented.  Psychiatric: Mood and affect mildly flat. Behavior is normal. Judgment and thought content normal.     BMET    Component Value Date/Time   NA 135 05/14/2018 0914   K 4.3 05/14/2018 0914   CL 99 05/14/2018 0914   CO2 29 05/14/2018 0914   GLUCOSE 98 05/14/2018 0914   BUN 19 05/14/2018 0914   CREATININE 0.58 05/14/2018 0914   CALCIUM 9.7 05/14/2018 0914    Lipid Panel     Component Value Date/Time   CHOL 242 (H)  07/17/2017 1622   TRIG 66.0 07/17/2017 1622   HDL 78.50 07/17/2017 1622   CHOLHDL 3 07/17/2017 1622   VLDL 13.2 07/17/2017 1622   LDLCALC 150 (H) 07/17/2017 1622    CBC    Component Value Date/Time   WBC 6.4 05/14/2018 0914   RBC 4.12 05/14/2018 0914   HGB 12.7 05/14/2018 0914   HCT 37.2 05/14/2018 0914  PLT 348.0 05/14/2018 0914   MCV 90.3 05/14/2018 0914   MCHC 34.1 05/14/2018 0914   RDW 14.6 05/14/2018 0914    Hgb A1C No results found for: HGBA1C          Assessment & Plan:   Sore Throat, Reflux, Abdominal Cramping:  Will check H Pylori RX for Omeprazole 20 mg daily in am Discussed how keeping a food diary can help identify triggers Discussed how weight loss could help improve symptoms  Update me in 2 weeks and let me know how you are doing  Constipation:  Encouraged high fiber diet. Encouraged adequate water intake Will monitor  Fatigue, Weight Gain, Insomnia, Depression:  Will check CBC, CMET, TSH Likely secondary to depression Support offered today She is not interested in seeing a therapist Will trial Sertraline 50 mg PO QHS  Return precautions discussed .mecrerd   Update me in 4 weeks and let me know how you are doing

## 2019-08-09 ENCOUNTER — Encounter: Payer: Self-pay | Admitting: Internal Medicine

## 2019-08-20 ENCOUNTER — Other Ambulatory Visit: Payer: Self-pay | Admitting: Internal Medicine

## 2019-08-20 DIAGNOSIS — F320 Major depressive disorder, single episode, mild: Secondary | ICD-10-CM

## 2019-09-03 ENCOUNTER — Telehealth: Payer: Self-pay | Admitting: Internal Medicine

## 2019-09-03 NOTE — Telephone Encounter (Signed)
Patient wants to know if she is due for pneumonia vaccine. Please advise.

## 2019-09-10 ENCOUNTER — Encounter: Payer: Self-pay | Admitting: Internal Medicine

## 2019-09-10 NOTE — Telephone Encounter (Signed)
msg sent via my chart

## 2019-10-12 ENCOUNTER — Encounter: Payer: Medicare Other | Admitting: Internal Medicine

## 2019-11-11 ENCOUNTER — Encounter: Payer: Self-pay | Admitting: Internal Medicine

## 2019-11-11 ENCOUNTER — Ambulatory Visit (INDEPENDENT_AMBULATORY_CARE_PROVIDER_SITE_OTHER): Payer: Medicare Other | Admitting: Internal Medicine

## 2019-11-11 ENCOUNTER — Other Ambulatory Visit: Payer: Self-pay

## 2019-11-11 VITALS — BP 128/84 | HR 73 | Temp 97.3°F | Ht 64.5 in | Wt 222.0 lb

## 2019-11-11 DIAGNOSIS — Z23 Encounter for immunization: Secondary | ICD-10-CM

## 2019-11-11 DIAGNOSIS — Z0000 Encounter for general adult medical examination without abnormal findings: Secondary | ICD-10-CM | POA: Diagnosis not present

## 2019-11-11 DIAGNOSIS — E7800 Pure hypercholesterolemia, unspecified: Secondary | ICD-10-CM

## 2019-11-11 DIAGNOSIS — K2100 Gastro-esophageal reflux disease with esophagitis, without bleeding: Secondary | ICD-10-CM | POA: Diagnosis not present

## 2019-11-11 DIAGNOSIS — F320 Major depressive disorder, single episode, mild: Secondary | ICD-10-CM

## 2019-11-11 NOTE — Assessment & Plan Note (Addendum)
CBC, CMET today Continue Omeprazole 20 mg daily Will monitor

## 2019-11-11 NOTE — Assessment & Plan Note (Addendum)
Continue Sertraline as prescribed Support offered today Will monitor

## 2019-11-11 NOTE — Progress Notes (Signed)
HPI:  Pt presents to the clinic today for her subsequent annual Medicare Wellness Exam.  GERD: She cannot recall any triggers. Still denies breakthrough on Omeprazole. There is no upper GI on file.   Depression: Chronic dysthymia. Managed on Sertraline.  She does not see a therapist. She denies SI/HI.    Past Medical History:  Diagnosis Date  . Allergy   . Basal cell carcinoma   . Chicken pox   . GERD (gastroesophageal reflux disease)   . Heart murmur   . History of blood transfusion   . History of stomach ulcers   . Hx of adenomatous polyp of colon 09/05/2017  . Post-operative nausea and vomiting    "slow to wake up"    Current Outpatient Medications  Medication Sig Dispense Refill  . calcium carbonate (TUMS - DOSED IN MG ELEMENTAL CALCIUM) 500 MG chewable tablet Chew 1 tablet by mouth daily.    . cetirizine (ZYRTEC) 10 MG tablet Take 10 mg by mouth daily.    . Multiple Vitamins-Minerals (VISION FORMULA EYE HEALTH) CAPS Take 2 capsules by mouth daily.    . Multiple Vitamins-Minerals (WOMENS MULTIVITAMIN PO) Take 1 tablet by mouth daily.    Marland Kitchen omeprazole (PRILOSEC) 20 MG capsule Take 1 capsule (20 mg total) by mouth daily. 30 capsule 5  . Polyethyl Glycol-Propyl Glycol (SYSTANE OP) Apply 1 drop to eye 2 (two) times daily.     . sertraline (ZOLOFT) 50 MG tablet Take 1 tablet (50 mg total) by mouth at bedtime. SCHEDULE PHYSICAL 90 tablet 0   No current facility-administered medications for this visit.     Allergies  Allergen Reactions  . Phenobarbital Other (See Comments)    syncope  . Codeine Nausea And Vomiting    Family History  Problem Relation Age of Onset  . Arthritis Mother   . Uterine cancer Mother   . Diabetes Mother   . Heart disease Father   . Cancer Maternal Uncle   . Throat cancer Maternal Grandmother   . Diabetes Maternal Grandmother   . Prostate cancer Maternal Grandfather   . Colon cancer Neg Hx     Social History   Socioeconomic History  .  Marital status: Divorced    Spouse name: Not on file  . Number of children: Not on file  . Years of education: Not on file  . Highest education level: Not on file  Occupational History  . Not on file  Social Needs  . Financial resource strain: Not on file  . Food insecurity    Worry: Not on file    Inability: Not on file  . Transportation needs    Medical: Not on file    Non-medical: Not on file  Tobacco Use  . Smoking status: Former Research scientist (life sciences)  . Smokeless tobacco: Never Used  Substance and Sexual Activity  . Alcohol use: No  . Drug use: No  . Sexual activity: Not Currently  Lifestyle  . Physical activity    Days per week: Not on file    Minutes per session: Not on file  . Stress: Not on file  Relationships  . Social Herbalist on phone: Not on file    Gets together: Not on file    Attends religious service: Not on file    Active member of club or organization: Not on file    Attends meetings of clubs or organizations: Not on file    Relationship status: Not on file  . Intimate partner  violence    Fear of current or ex partner: Not on file    Emotionally abused: Not on file    Physically abused: Not on file    Forced sexual activity: Not on file  Other Topics Concern  . Not on file  Social History Narrative  . Not on file    Hospitiliaztions: None  Health Maintenance:    Flu: 02/2018  Tetanus: unsure  Pneumovax:  never  Prevnar: 06/2017  Shingrix: never, will check with insurance  Mammogram: > 2 years ago  Pap Smear: 2014,she follows up with OBGYN  Bone Density: never  Colon Screening: 08/2017, next one in 5 years  Eye Doctor: biannually  Dental Exam: as needed   Providers:   PCP: Webb Silversmith, NP  Ophthalmologist: Dr.  Baird Cancer  Gastroenterologist: Dr. Carlean Purl   I have personally reviewed and have noted:  1. The patient's medical and social history 2. Their use of alcohol, tobacco or illicit drugs 3. Their current medications and  supplements 4. The patient's functional ability including ADL's, fall risks, home safety risks and hearing or visual impairment. 5. Diet and physical activities 6. Evidence for depression or mood disorder  Subjective:   Review of Systems:   Constitutional: Denies fever, malaise, fatigue, headache or abrupt weight changes.  HEENT: Denies eye pain, eye redness, ear pain, ringing in the ears, wax buildup, runny nose, nasal congestion, bloody nose, or sore throat. Respiratory: Denies difficulty breathing, shortness of breath, cough or sputum production.   Cardiovascular: Denies chest pain, chest tightness, palpitations or swelling in the hands or feet.  Gastrointestinal: Pt reports reflux. Denies abdominal pain, bloating, constipation, diarrhea or blood in the stool.  GU: Denies urgency, frequency, pain with urination, burning sensation, blood in urine, odor or discharge. Musculoskeletal: Denies decrease in range of motion, difficulty with gait, muscle pain or joint pain and swelling.  Skin: Denies redness, rashes, lesions or ulcercations.  Neurological: Denies dizziness, difficulty with memory, difficulty with speech or problems with balance and coordination.  Psych: Pt has a history of depression. Denies anxiety, SI/HI.  No other specific complaints in a complete review of systems (except as listed in HPI above).  Objective:  PE:  BP 128/84   Pulse 73   Temp (!) 97.3 F (36.3 C) (Temporal)   Ht 5' 4.5" (1.638 m)   Wt 222 lb (100.7 kg)   SpO2 98%   BMI 37.52 kg/m    Wt Readings from Last 3 Encounters:  07/29/19 213 lb (96.6 kg)  06/04/18 196 lb (88.9 kg)  05/29/18 196 lb 6.4 oz (89.1 kg)    General: Appears their stated age, obese, in NAD. Skin: Warm, dry and intact. No ulcerations noted. HEENT: Head: normal shape and size; Eyes: sclera white, no icterus, conjunctiva pink and EOMs intact Neck: Neck supple, trachea midline. No masses, lumps or thyromegaly present.   Cardiovascular: Normal rate and rhythm. S1,S2 noted.  No murmur, rubs or gallops noted. No JVD or BLE edema.  Pulmonary/Chest: Normal effort and positive vesicular breath sounds. No respiratory distress. No wheezes, rales or ronchi noted.  Abdomen: Soft and nontender. Normal bowel sounds. No distention or masses noted. Liver, spleen and kidneys non palpable. Musculoskeletal: Strength 5/5 BUE/BLE. No signs of joint swelling.  Neurological: Alert and oriented. Cranial nerves II-XII grossly intact. Coordination normal.  Psychiatric: Mood and affect normal. Behavior is normal. Judgment and thought content normal.     BMET    Component Value Date/Time   NA 132 (L)  07/29/2019 1041   K 4.7 07/29/2019 1041   CL 94 (L) 07/29/2019 1041   CO2 30 07/29/2019 1041   GLUCOSE 105 (H) 07/29/2019 1041   BUN 14 07/29/2019 1041   CREATININE 0.67 07/29/2019 1041   CALCIUM 10.5 07/29/2019 1041    Lipid Panel     Component Value Date/Time   CHOL 242 (H) 07/17/2017 1622   TRIG 66.0 07/17/2017 1622   HDL 78.50 07/17/2017 1622   CHOLHDL 3 07/17/2017 1622   VLDL 13.2 07/17/2017 1622   LDLCALC 150 (H) 07/17/2017 1622    CBC    Component Value Date/Time   WBC 7.2 07/29/2019 1041   RBC 4.25 07/29/2019 1041   HGB 12.7 07/29/2019 1041   HCT 38.1 07/29/2019 1041   PLT 370.0 07/29/2019 1041   MCV 89.6 07/29/2019 1041   MCHC 33.3 07/29/2019 1041   RDW 14.8 07/29/2019 1041    Hgb A1C No results found for: HGBA1C    Assessment and Plan:   Medicare Annual Wellness Visit:  Diet: She eats meats. She consumes fruits and veggies daily. She does not eat fried foods. She drinks mostly water, coffee and tea.  Physical activity: [facility] She has been walking 7,000 steps per day.  Depression/mood screen: Negative, PHQ 9 score of 2 Hearing: Intact to whispered voice Visual acuity: Grossly normal, performs annual eye exam  ADLs: Capable Fall risk:  None Home safety: Good Cognitive evaluation: Intact to orientation, naming, recall and repetition EOL planning: Adv directives, DNR/ I disagree but these are her wishes.  Preventative Medicine: Flu and pneumovax today. She declines tetanus for financial reasons. Prevnar UTD. She declines shingrix at this time. She declines mammogram, pap smear and bone density. Colon screening UTD. Encouraged her to consume a balanced diet and exercise regimen. Advised her to see an eye doctor and dentist. Will check CBC, CMET, Lipid profile today. She refuses Vit D today as insurance will not pay. Due dates for screening exams given to patient as part of her AVS.   Next appointment: 1 year, Medicare Wellness Exam   Webb Silversmith, NP

## 2019-11-11 NOTE — Patient Instructions (Signed)
Health Maintenance, Female Adopting a healthy lifestyle and getting preventive care are important in promoting health and wellness. Ask your health care provider about:  The right schedule for you to have regular tests and exams.  Things you can do on your own to prevent diseases and keep yourself healthy. What should I know about diet, weight, and exercise? Eat a healthy diet   Eat a diet that includes plenty of vegetables, fruits, low-fat dairy products, and lean protein.  Do not eat a lot of foods that are high in solid fats, added sugars, or sodium. Maintain a healthy weight Body mass index (BMI) is used to identify weight problems. It estimates body fat based on height and weight. Your health care provider can help determine your BMI and help you achieve or maintain a healthy weight. Get regular exercise Get regular exercise. This is one of the most important things you can do for your health. Most adults should:  Exercise for at least 150 minutes each week. The exercise should increase your heart rate and make you sweat (moderate-intensity exercise).  Do strengthening exercises at least twice a week. This is in addition to the moderate-intensity exercise.  Spend less time sitting. Even light physical activity can be beneficial. Watch cholesterol and blood lipids Have your blood tested for lipids and cholesterol at 68 years of age, then have this test every 5 years. Have your cholesterol levels checked more often if:  Your lipid or cholesterol levels are high.  You are older than 68 years of age.  You are at high risk for heart disease. What should I know about cancer screening? Depending on your health history and family history, you may need to have cancer screening at various ages. This may include screening for:  Breast cancer.  Cervical cancer.  Colorectal cancer.  Skin cancer.  Lung cancer. What should I know about heart disease, diabetes, and high blood  pressure? Blood pressure and heart disease  High blood pressure causes heart disease and increases the risk of stroke. This is more likely to develop in people who have high blood pressure readings, are of African descent, or are overweight.  Have your blood pressure checked: ? Every 3-5 years if you are 18-39 years of age. ? Every year if you are 40 years old or older. Diabetes Have regular diabetes screenings. This checks your fasting blood sugar level. Have the screening done:  Once every three years after age 40 if you are at a normal weight and have a low risk for diabetes.  More often and at a younger age if you are overweight or have a high risk for diabetes. What should I know about preventing infection? Hepatitis B If you have a higher risk for hepatitis B, you should be screened for this virus. Talk with your health care provider to find out if you are at risk for hepatitis B infection. Hepatitis C Testing is recommended for:  Everyone born from 1945 through 1965.  Anyone with known risk factors for hepatitis C. Sexually transmitted infections (STIs)  Get screened for STIs, including gonorrhea and chlamydia, if: ? You are sexually active and are younger than 68 years of age. ? You are older than 68 years of age and your health care provider tells you that you are at risk for this type of infection. ? Your sexual activity has changed since you were last screened, and you are at increased risk for chlamydia or gonorrhea. Ask your health care provider if   you are at risk.  Ask your health care provider about whether you are at high risk for HIV. Your health care provider may recommend a prescription medicine to help prevent HIV infection. If you choose to take medicine to prevent HIV, you should first get tested for HIV. You should then be tested every 3 months for as long as you are taking the medicine. Pregnancy  If you are about to stop having your period (premenopausal) and  you may become pregnant, seek counseling before you get pregnant.  Take 400 to 800 micrograms (mcg) of folic acid every day if you become pregnant.  Ask for birth control (contraception) if you want to prevent pregnancy. Osteoporosis and menopause Osteoporosis is a disease in which the bones lose minerals and strength with aging. This can result in bone fractures. If you are 65 years old or older, or if you are at risk for osteoporosis and fractures, ask your health care provider if you should:  Be screened for bone loss.  Take a calcium or vitamin D supplement to lower your risk of fractures.  Be given hormone replacement therapy (HRT) to treat symptoms of menopause. Follow these instructions at home: Lifestyle  Do not use any products that contain nicotine or tobacco, such as cigarettes, e-cigarettes, and chewing tobacco. If you need help quitting, ask your health care provider.  Do not use street drugs.  Do not share needles.  Ask your health care provider for help if you need support or information about quitting drugs. Alcohol use  Do not drink alcohol if: ? Your health care provider tells you not to drink. ? You are pregnant, may be pregnant, or are planning to become pregnant.  If you drink alcohol: ? Limit how much you use to 0-1 drink a day. ? Limit intake if you are breastfeeding.  Be aware of how much alcohol is in your drink. In the [place] one drink equals one 12 oz bottle of beer (355 mL), one 5 oz glass of wine (148 mL), or one 1 oz glass of hard liquor (44 mL). General instructions  Schedule regular health, dental, and eye exams.  Stay current with your vaccines.  Tell your health care provider if: ? You often feel depressed. ? You have ever been abused or do not feel safe at home. Summary  Adopting a healthy lifestyle and getting preventive care are important in promoting health and wellness.  Follow your health care provider's instructions about healthy  diet, exercising, and getting tested or screened for diseases.  Follow your health care provider's instructions on monitoring your cholesterol and blood pressure. This information is not intended to replace advice given to you by your health care provider. Make sure you discuss any questions you have with your health care provider. Document Released: 06/24/2011 Document Revised: 12/02/2018 Document Reviewed: 12/02/2018 Elsevier Patient Education  2020 Elsevier Inc.  

## 2019-11-12 LAB — LIPID PANEL
Cholesterol: 290 mg/dL — ABNORMAL HIGH (ref 0–200)
HDL: 105.8 mg/dL (ref >39.00)
LDL Cholesterol: 166 mg/dL — ABNORMAL HIGH (ref 0–99)
NonHDL: 184.37
Total CHOL/HDL Ratio: 3
Triglycerides: 91 mg/dL (ref 0.0–149.0)
VLDL: 18.2 mg/dL (ref 0.0–40.0)

## 2019-11-12 LAB — CBC
HCT: 36.9 % (ref 36.0–46.0)
Hemoglobin: 12.5 g/dL (ref 12.0–15.0)
MCHC: 33.9 g/dL (ref 30.0–36.0)
MCV: 89.5 fl (ref 78.0–100.0)
Platelets: 368 10*3/uL (ref 150.0–400.0)
RBC: 4.12 Mil/uL (ref 3.87–5.11)
RDW: 14.9 % (ref 11.5–15.5)
WBC: 9.5 10*3/uL (ref 4.0–10.5)

## 2019-11-12 LAB — COMPREHENSIVE METABOLIC PANEL
ALT: 17 U/L (ref 0–35)
AST: 20 U/L (ref 0–37)
Albumin: 4.5 g/dL (ref 3.5–5.2)
Alkaline Phosphatase: 73 U/L (ref 39–117)
BUN: 16 mg/dL (ref 6–23)
CO2: 28 mEq/L (ref 19–32)
Calcium: 10 mg/dL (ref 8.4–10.5)
Chloride: 96 mEq/L (ref 96–112)
Creatinine, Ser: 0.59 mg/dL (ref 0.40–1.20)
GFR: 101.39 mL/min (ref >60.00)
Glucose, Bld: 99 mg/dL (ref 70–99)
Potassium: 4.6 mEq/L (ref 3.5–5.1)
Sodium: 133 mEq/L — ABNORMAL LOW (ref 135–145)
Total Bilirubin: 0.4 mg/dL (ref 0.2–1.2)
Total Protein: 7.7 g/dL (ref 6.0–8.3)

## 2019-11-12 NOTE — Addendum Note (Signed)
Addended by: Lurlean Nanny on: 11/12/2019 11:52 AM   Modules accepted: Orders

## 2019-11-17 ENCOUNTER — Encounter: Payer: Self-pay | Admitting: Internal Medicine

## 2019-11-17 MED ORDER — ATORVASTATIN CALCIUM 10 MG PO TABS: 10.0000 mg | ORAL_TABLET | Freq: Every day | ORAL | 2 refills | Status: DC

## 2019-11-17 NOTE — Addendum Note (Signed)
Addended by: Lurlean Nanny on: 11/17/2019 05:39 PM   Modules accepted: Orders

## 2019-12-11 ENCOUNTER — Other Ambulatory Visit: Payer: Self-pay | Admitting: Internal Medicine

## 2019-12-11 DIAGNOSIS — F320 Major depressive disorder, single episode, mild: Secondary | ICD-10-CM

## 2020-01-16 ENCOUNTER — Other Ambulatory Visit: Payer: Self-pay | Admitting: Internal Medicine

## 2020-01-16 DIAGNOSIS — K2100 Gastro-esophageal reflux disease with esophagitis, without bleeding: Secondary | ICD-10-CM

## 2020-01-16 DIAGNOSIS — J029 Acute pharyngitis, unspecified: Secondary | ICD-10-CM

## 2020-01-16 DIAGNOSIS — R109 Unspecified abdominal pain: Secondary | ICD-10-CM

## 2020-01-17 ENCOUNTER — Encounter: Payer: Self-pay | Admitting: Internal Medicine

## 2020-02-10 ENCOUNTER — Other Ambulatory Visit: Payer: Self-pay | Admitting: Internal Medicine

## 2020-02-10 DIAGNOSIS — E7800 Pure hypercholesterolemia, unspecified: Secondary | ICD-10-CM

## 2020-02-13 ENCOUNTER — Ambulatory Visit: Payer: Medicare Other | Attending: Internal Medicine

## 2020-02-13 DIAGNOSIS — Z23 Encounter for immunization: Secondary | ICD-10-CM | POA: Insufficient documentation

## 2020-02-13 NOTE — Progress Notes (Signed)
   Covid-19 Vaccination Clinic  Name:  Terri Crawford    MRN: [id number] DOB: Oct 09, 1951  02/13/2020  [name] was observed post Covid-19 immunization for 15 minutes without incidence. She was provided with Vaccine Information Sheet and instruction to access the V-Safe system.   [name] was instructed to call 911 with any severe reactions post vaccine: Marland Kitchen Difficulty breathing  . Swelling of your face and throat  . A fast heartbeat  . A bad rash all over your body  . Dizziness and weakness    Immunizations Administered    Name Date Dose VIS Date Route   Pfizer COVID-19 Vaccine 02/13/2020 10:01 AM 0.3 mL 12/03/2019 Intramuscular   Manufacturer: [place]   Lot: [id number]   [place]: [id number]

## 2020-02-25 ENCOUNTER — Other Ambulatory Visit: Payer: Self-pay

## 2020-02-25 ENCOUNTER — Other Ambulatory Visit (INDEPENDENT_AMBULATORY_CARE_PROVIDER_SITE_OTHER): Payer: Medicare Other

## 2020-02-25 DIAGNOSIS — E7800 Pure hypercholesterolemia, unspecified: Secondary | ICD-10-CM

## 2020-02-25 LAB — COMPREHENSIVE METABOLIC PANEL
ALT: 21 U/L (ref 0–35)
AST: 21 U/L (ref 0–37)
Albumin: 4.1 g/dL (ref 3.5–5.2)
Alkaline Phosphatase: 74 U/L (ref 39–117)
BUN: 19 mg/dL (ref 6–23)
CO2: 26 mEq/L (ref 19–32)
Calcium: 9.9 mg/dL (ref 8.4–10.5)
Chloride: 98 mEq/L (ref 96–112)
Creatinine, Ser: 0.56 mg/dL (ref 0.40–1.20)
GFR: 107.6 mL/min (ref >60.00)
Glucose, Bld: 113 mg/dL — ABNORMAL HIGH (ref 70–99)
Potassium: 4.1 mEq/L (ref 3.5–5.1)
Sodium: 134 mEq/L — ABNORMAL LOW (ref 135–145)
Total Bilirubin: 0.6 mg/dL (ref 0.2–1.2)
Total Protein: 7.5 g/dL (ref 6.0–8.3)

## 2020-02-25 LAB — LIPID PANEL
Cholesterol: 227 mg/dL — ABNORMAL HIGH (ref 0–200)
HDL: 98.9 mg/dL (ref >39.00)
LDL Cholesterol: 114 mg/dL — ABNORMAL HIGH (ref 0–99)
NonHDL: 127.93
Total CHOL/HDL Ratio: 2
Triglycerides: 71 mg/dL (ref 0.0–149.0)
VLDL: 14.2 mg/dL (ref 0.0–40.0)

## 2020-03-04 ENCOUNTER — Other Ambulatory Visit: Payer: Self-pay | Admitting: Internal Medicine

## 2020-03-04 DIAGNOSIS — E7800 Pure hypercholesterolemia, unspecified: Secondary | ICD-10-CM

## 2020-03-07 ENCOUNTER — Ambulatory Visit: Payer: Medicare Other | Attending: Internal Medicine

## 2020-03-07 DIAGNOSIS — Z23 Encounter for immunization: Secondary | ICD-10-CM

## 2020-03-07 NOTE — Progress Notes (Signed)
   Covid-19 Vaccination Clinic  Name:  Terri Crawford    MRN: [id number] DOB: May 18, 1951  03/07/2020  Terri Crawford was observed post Covid-19 immunization for 15 minutes without incident. She was provided with Vaccine Information Sheet and instruction to access the V-Safe system.   Terri Crawford was instructed to call 911 with any severe reactions post vaccine: Marland Kitchen Difficulty breathing  . Swelling of face and throat  . A fast heartbeat  . A bad rash all over body  . Dizziness and weakness   Immunizations Administered    Name Date Dose VIS Date Route   Pfizer COVID-19 Vaccine 03/07/2020  9:47 AM 0.3 mL 12/03/2019 Intramuscular   Manufacturer: [place]   Lot: [id number]   [place]: [id number]

## 2020-07-06 ENCOUNTER — Encounter: Payer: Self-pay | Admitting: Family Medicine

## 2020-07-06 ENCOUNTER — Other Ambulatory Visit: Payer: Self-pay | Admitting: Internal Medicine

## 2020-07-06 ENCOUNTER — Other Ambulatory Visit: Payer: Self-pay

## 2020-07-06 ENCOUNTER — Ambulatory Visit (INDEPENDENT_AMBULATORY_CARE_PROVIDER_SITE_OTHER): Payer: Medicare Other | Admitting: Family Medicine

## 2020-07-06 VITALS — BP 142/80 | HR 96 | Temp 99.1°F | Wt 220.2 lb

## 2020-07-06 DIAGNOSIS — J029 Acute pharyngitis, unspecified: Secondary | ICD-10-CM

## 2020-07-06 DIAGNOSIS — N3001 Acute cystitis with hematuria: Secondary | ICD-10-CM | POA: Diagnosis not present

## 2020-07-06 DIAGNOSIS — R300 Dysuria: Secondary | ICD-10-CM | POA: Diagnosis not present

## 2020-07-06 DIAGNOSIS — R109 Unspecified abdominal pain: Secondary | ICD-10-CM

## 2020-07-06 DIAGNOSIS — K2100 Gastro-esophageal reflux disease with esophagitis, without bleeding: Secondary | ICD-10-CM

## 2020-07-06 LAB — POC URINALSYSI DIPSTICK (AUTOMATED)
Bilirubin, UA: NEGATIVE
Glucose, UA: NEGATIVE
Nitrite, UA: POSITIVE
Protein, UA: POSITIVE — AB
Spec Grav, UA: 1.02 (ref 1.010–1.025)
Urobilinogen, UA: NEGATIVE E.U./dL — AB
pH, UA: 6 (ref 5.0–8.0)

## 2020-07-06 MED ORDER — NITROFURANTOIN MONOHYD MACRO 100 MG PO CAPS: 100.0000 mg | ORAL_CAPSULE | Freq: Two times a day (BID) | ORAL | 0 refills | Status: AC

## 2020-07-06 NOTE — Patient Instructions (Signed)
Take the medicine twice a day for 5 days  Mychart with results if you need changes over the week  Return if not improved at the end of 5 days

## 2020-07-06 NOTE — Progress Notes (Signed)
Subjective:     Terri Crawford is a 69 y.o. female presenting for Dysuria (started today ) and Hematuria (started today )     HPI  #Dysuria - frequency - symptoms started today  - got up a few times overnight to use the bathroom - had pelvic pressure and burning - pink discoloration - no urgency - no n/v, fever/chills - no recent intercourse - no vaginal discharge  Review of Systems   Social History   Tobacco Use  Smoking Status Former Smoker  Smokeless Tobacco Never Used        Objective:    BP Readings from Last 3 Encounters:  07/06/20 (!) 142/80  11/11/19 128/84  07/29/19 130/76   Wt Readings from Last 3 Encounters:  07/06/20 220 lb 4 oz (99.9 kg)  11/11/19 222 lb (100.7 kg)  07/29/19 213 lb (96.6 kg)    BP (!) 142/80   Pulse 96   Temp 99.1 F (37.3 C) (Oral)   Wt 220 lb 4 oz (99.9 kg)   SpO2 97%   BMI 37.22 kg/m    Physical Exam Constitutional:      General: She is not in acute distress.    Appearance: She is well-developed. She is obese. She is not diaphoretic.  HENT:     Head: Normocephalic and atraumatic.     Right Ear: External ear normal.     Left Ear: External ear normal.     Nose: Nose normal.  Eyes:     Conjunctiva/sclera: Conjunctivae normal.  Cardiovascular:     Rate and Rhythm: Normal rate and regular rhythm.     Heart sounds: Normal heart sounds.  Pulmonary:     Effort: Pulmonary effort is normal.  Abdominal:     General: Bowel sounds are normal. There is no distension.     Palpations: Abdomen is soft.     Tenderness: There is no abdominal tenderness. There is no right CVA tenderness, left CVA tenderness or guarding.  Musculoskeletal:     Cervical back: Neck supple.  Skin:    General: Skin is warm and dry.     Capillary Refill: Capillary refill takes less than 2 seconds.  Neurological:     Mental Status: She is alert. Mental status is at baseline.  Psychiatric:        Mood and Affect: Mood normal.         Behavior: Behavior normal.      UA: +LE, +nitrite     Assessment & Plan:   Problem List Items Addressed This Visit    None    Visit Diagnoses    Acute cystitis with hematuria    -  Primary   Relevant Medications   nitrofurantoin, macrocrystal-monohydrate, (MACROBID) 100 MG capsule   Dysuria       Relevant Orders   POCT Urinalysis Dipstick (Automated) (Completed)   Urine Culture     Hx and UA consistent with UTI. Nitrofuratonin prescribed. F/u if not improved  Also discussed that hematuria likely related to UTI, however, could consider follow-up when well if she would like.   Discussed risk factors - she has none.   Return if symptoms worsen or fail to improve.  Lesleigh Noe, MD  This visit occurred during the SARS-CoV-2 public health emergency.  Safety protocols were in place, including screening questions prior to the visit, additional usage of staff PPE, and extensive cleaning of exam room while observing appropriate contact time as indicated for disinfecting solutions.

## 2020-07-08 LAB — URINE CULTURE
MICRO NUMBER:: 10709683
SPECIMEN QUALITY:: ADEQUATE

## 2020-07-14 DIAGNOSIS — F329 Major depressive disorder, single episode, unspecified: Secondary | ICD-10-CM | POA: Diagnosis not present

## 2020-07-18 ENCOUNTER — Encounter: Payer: Self-pay | Admitting: Internal Medicine

## 2020-07-18 DIAGNOSIS — F329 Major depressive disorder, single episode, unspecified: Secondary | ICD-10-CM | POA: Diagnosis not present

## 2020-07-25 DIAGNOSIS — F329 Major depressive disorder, single episode, unspecified: Secondary | ICD-10-CM | POA: Diagnosis not present

## 2020-08-01 DIAGNOSIS — F329 Major depressive disorder, single episode, unspecified: Secondary | ICD-10-CM | POA: Diagnosis not present

## 2020-08-08 DIAGNOSIS — F329 Major depressive disorder, single episode, unspecified: Secondary | ICD-10-CM | POA: Diagnosis not present

## 2020-08-17 DIAGNOSIS — F329 Major depressive disorder, single episode, unspecified: Secondary | ICD-10-CM | POA: Diagnosis not present

## 2020-08-17 DIAGNOSIS — H40051 Ocular hypertension, right eye: Secondary | ICD-10-CM | POA: Diagnosis not present

## 2020-08-17 DIAGNOSIS — H40053 Ocular hypertension, bilateral: Secondary | ICD-10-CM | POA: Diagnosis not present

## 2020-08-17 DIAGNOSIS — H353233 Exudative age-related macular degeneration, bilateral, with inactive scar: Secondary | ICD-10-CM | POA: Diagnosis not present

## 2020-08-25 DIAGNOSIS — F329 Major depressive disorder, single episode, unspecified: Secondary | ICD-10-CM | POA: Diagnosis not present

## 2020-08-29 ENCOUNTER — Other Ambulatory Visit: Payer: Self-pay | Admitting: Internal Medicine

## 2020-08-29 DIAGNOSIS — E7800 Pure hypercholesterolemia, unspecified: Secondary | ICD-10-CM

## 2020-08-30 ENCOUNTER — Encounter: Payer: Self-pay | Admitting: Internal Medicine

## 2020-09-07 DIAGNOSIS — F329 Major depressive disorder, single episode, unspecified: Secondary | ICD-10-CM | POA: Diagnosis not present

## 2020-09-18 DIAGNOSIS — F329 Major depressive disorder, single episode, unspecified: Secondary | ICD-10-CM | POA: Diagnosis not present

## 2020-09-27 ENCOUNTER — Other Ambulatory Visit: Payer: Self-pay | Admitting: Internal Medicine

## 2020-09-27 DIAGNOSIS — J029 Acute pharyngitis, unspecified: Secondary | ICD-10-CM

## 2020-09-27 DIAGNOSIS — R109 Unspecified abdominal pain: Secondary | ICD-10-CM

## 2020-09-27 DIAGNOSIS — K2100 Gastro-esophageal reflux disease with esophagitis, without bleeding: Secondary | ICD-10-CM

## 2020-10-02 DIAGNOSIS — F329 Major depressive disorder, single episode, unspecified: Secondary | ICD-10-CM | POA: Diagnosis not present

## 2020-10-10 DIAGNOSIS — H40053 Ocular hypertension, bilateral: Secondary | ICD-10-CM | POA: Diagnosis not present

## 2020-10-10 DIAGNOSIS — H353233 Exudative age-related macular degeneration, bilateral, with inactive scar: Secondary | ICD-10-CM | POA: Diagnosis not present

## 2020-10-13 DIAGNOSIS — F329 Major depressive disorder, single episode, unspecified: Secondary | ICD-10-CM | POA: Diagnosis not present

## 2020-10-16 DIAGNOSIS — Z23 Encounter for immunization: Secondary | ICD-10-CM | POA: Diagnosis not present

## 2020-11-03 ENCOUNTER — Telehealth: Payer: Self-pay | Admitting: Internal Medicine

## 2020-11-03 DIAGNOSIS — J029 Acute pharyngitis, unspecified: Secondary | ICD-10-CM

## 2020-11-03 DIAGNOSIS — K2100 Gastro-esophageal reflux disease with esophagitis, without bleeding: Secondary | ICD-10-CM

## 2020-11-03 DIAGNOSIS — R109 Unspecified abdominal pain: Secondary | ICD-10-CM

## 2020-11-03 MED ORDER — OMEPRAZOLE 20 MG PO CPDR: DELAYED_RELEASE_CAPSULE | ORAL | 0 refills | Status: DC

## 2020-11-03 NOTE — Addendum Note (Signed)
Addended by: Jearld Fenton on: 11/03/2020 11:24 AM   Modules accepted: Orders

## 2020-11-03 NOTE — Telephone Encounter (Signed)
Pt is requesting a refill of omeprazole 20mg , at least enough to hold her over until her CPE in January.  She is completely out.  Pharmacy is [facility] in [place].

## 2020-11-03 NOTE — Telephone Encounter (Signed)
Refilled

## 2020-11-14 ENCOUNTER — Encounter: Payer: Medicare Other | Admitting: Internal Medicine

## 2020-11-21 DIAGNOSIS — F329 Major depressive disorder, single episode, unspecified: Secondary | ICD-10-CM | POA: Diagnosis not present

## 2020-11-22 ENCOUNTER — Other Ambulatory Visit: Payer: Self-pay | Admitting: Internal Medicine

## 2020-11-22 DIAGNOSIS — E7800 Pure hypercholesterolemia, unspecified: Secondary | ICD-10-CM

## 2020-11-29 ENCOUNTER — Other Ambulatory Visit: Payer: Self-pay | Admitting: Internal Medicine

## 2020-11-29 DIAGNOSIS — F320 Major depressive disorder, single episode, mild: Secondary | ICD-10-CM

## 2020-12-08 DIAGNOSIS — F329 Major depressive disorder, single episode, unspecified: Secondary | ICD-10-CM | POA: Diagnosis not present

## 2020-12-21 ENCOUNTER — Encounter: Payer: Self-pay | Admitting: Internal Medicine

## 2020-12-22 ENCOUNTER — Encounter: Payer: Self-pay | Admitting: Internal Medicine

## 2020-12-22 DIAGNOSIS — F329 Major depressive disorder, single episode, unspecified: Secondary | ICD-10-CM | POA: Diagnosis not present

## 2020-12-25 ENCOUNTER — Encounter: Payer: Self-pay | Admitting: Internal Medicine

## 2021-01-05 ENCOUNTER — Encounter: Payer: Medicare Other | Admitting: Internal Medicine

## 2021-01-05 NOTE — Progress Notes (Deleted)
HPI:  Patient presents the clinic today for her subsequent annual Medicare wellness exam.  She is also due to follow-up chronic conditions.  GERD: She is not sure what triggers this.  She denies breakthrough on Omeprazole.  There is no upper GI on file.  Depression: Chronic dysthymia.  Managed on Sertraline.  She does not see a therapist.  She denies anxiety, SI/HI.  Past Medical History:  Diagnosis Date  . Allergy   . Basal cell carcinoma   . Chicken pox   . GERD (gastroesophageal reflux disease)   . Heart murmur   . History of blood transfusion   . History of stomach ulcers   . Hx of adenomatous polyp of colon 09/05/2017  . Post-operative nausea and vomiting    "slow to wake up"    Current Outpatient Medications  Medication Sig Dispense Refill  . atorvastatin (LIPITOR) 10 MG tablet TAKE 1 TABLET BY MOUTH EVERY DAY 90 tablet 0  . cetirizine (ZYRTEC) 10 MG tablet Take 10 mg by mouth daily.    . Multiple Vitamins-Minerals (VISION FORMULA EYE HEALTH) CAPS Take 2 capsules by mouth daily.    . Multiple Vitamins-Minerals (WOMENS MULTIVITAMIN PO) Take 1 tablet by mouth daily.    Marland Kitchen omeprazole (PRILOSEC) 20 MG capsule TAKE 1 CAPSULE BY MOUTH EVERY DAY 90 capsule 0  . Polyethyl Glycol-Propyl Glycol (SYSTANE OP) Apply 1 drop to eye 2 (two) times daily.     . sertraline (ZOLOFT) 50 MG tablet TAKE 1 TABLET BY MOUTH EVERY DAY 90 tablet 0   No current facility-administered medications for this visit.    Allergies  Allergen Reactions  . Phenobarbital Other (See Comments)    syncope  . Codeine Nausea And Vomiting    Family History  Problem Relation Age of Onset  . Arthritis Mother   . Uterine cancer Mother   . Diabetes Mother   . Heart disease Father   . Cancer Maternal Uncle   . Throat cancer Maternal Grandmother   . Diabetes Maternal Grandmother   . Prostate cancer Maternal Grandfather   . Colon cancer Neg Hx     Social History   Socioeconomic History  . Marital status:  Divorced    Spouse name: Not on file  . Number of children: Not on file  . Years of education: Not on file  . Highest education level: Not on file  Occupational History  . Not on file  Tobacco Use  . Smoking status: Former Research scientist (life sciences)  . Smokeless tobacco: Never Used  Vaping Use  . Vaping Use: Never used  Substance and Sexual Activity  . Alcohol use: No  . Drug use: No  . Sexual activity: Not Currently  Other Topics Concern  . Not on file  Social History Narrative  . Not on file   Social Determinants of Health   Financial Resource Strain: Not on file  Food Insecurity: Not on file  Transportation Needs: Not on file  Physical Activity: Not on file  Stress: Not on file  Social Connections: Not on file  Intimate Partner Violence: Not on file    Hospitiliaztions: None  Health Maintenance:    Flu: 10/2019             Tetanus: unsure             Pneumovax:  10/2019             Prevnar: 06/2017             Shingrix: never  Covid: Pfizer x 2             Mammogram: > 2 years ago             Pap Smear: 2014,she follows up with OBGYN             Bone Density: never             Colon Screening: 08/2017, next one in 5 years             Eye Doctor: biannually             Dental Exam: as needed    Providers:   PCP: Webb Silversmith, NP  Ophthalmologist: Dr.  Baird Cancer             Gastroenterologist: Dr. Carlean Purl    I have personally reviewed and have noted:  1. The patient's medical and social history 2. Their use of alcohol, tobacco or illicit drugs 3. Their current medications and supplements 4. The patient's functional ability including ADL's, fall risks, home safety risks and hearing or visual impairment. 5. Diet and physical activities 6. Evidence for depression or mood disorder  Subjective:   Review of Systems:   Constitutional: Denies fever, malaise, fatigue, headache or abrupt weight changes.  HEENT: Denies eye pain, eye redness, ear pain, ringing in the ears, wax  buildup, runny nose, nasal congestion, bloody nose, or sore throat. Respiratory: Denies difficulty breathing, shortness of breath, cough or sputum production.   Cardiovascular: Denies chest pain, chest tightness, palpitations or swelling in the hands or feet.  Gastrointestinal: Denies abdominal pain, bloating, constipation, diarrhea or blood in the stool.  GU: Denies urgency, frequency, pain with urination, burning sensation, blood in urine, odor or discharge. Musculoskeletal: Denies decrease in range of motion, difficulty with gait, muscle pain or joint pain and swelling.  Skin: Denies redness, rashes, lesions or ulcercations.  Neurological: Denies dizziness, difficulty with memory, difficulty with speech or problems with balance and coordination.  Psych: Denies anxiety, depression, SI/HI.  No other specific complaints in a complete review of systems (except as listed in HPI above).  Objective:  PE:   There were no vitals taken for this visit. Wt Readings from Last 3 Encounters:  07/06/20 220 lb 4 oz (99.9 kg)  11/11/19 222 lb (100.7 kg)  07/29/19 213 lb (96.6 kg)    General: Appears their stated age, well developed, well nourished in NAD. Skin: Warm, dry and intact. No rashes, lesions or ulcerations noted. HEENT: Head: normal shape and size; Eyes: sclera white, no icterus, conjunctiva pink, PERRLA and EOMs intact; Ears: Tm's gray and intact, normal light reflex; Throat/Mouth: Teeth present, mucosa pink and moist, no exudate, lesions or ulcerations noted.  Neck: Neck supple, trachea midline. No masses, lumps or thyromegaly present.  Cardiovascular: Normal rate and rhythm. S1,S2 noted.  No murmur, rubs or gallops noted. No JVD or BLE edema. No carotid bruits noted. Pulmonary/Chest: Normal effort and positive vesicular breath sounds. No respiratory distress. No wheezes, rales or ronchi noted.  Abdomen: Soft and nontender. Normal bowel sounds. No distention or masses noted. Liver, spleen  and kidneys non palpable. Musculoskeletal: Normal range of motion. Strength 5/5 BUE/BLE. No signs of joint swelling.  Neurological: Alert and oriented. Cranial nerves II-XII grossly intact. Coordination normal.  Psychiatric: Mood and affect normal. Behavior is normal. Judgment and thought content normal.   EKG:  BMET    Component Value Date/Time   NA 134 (L) 02/25/2020 0841   K 4.1 02/25/2020  0841   CL 98 02/25/2020 0841   CO2 26 02/25/2020 0841   GLUCOSE 113 (H) 02/25/2020 0841   BUN 19 02/25/2020 0841   CREATININE 0.56 02/25/2020 0841   CALCIUM 9.9 02/25/2020 0841    Lipid Panel     Component Value Date/Time   CHOL 227 (H) 02/25/2020 0841   TRIG 71.0 02/25/2020 0841   HDL 98.90 02/25/2020 0841   CHOLHDL 2 02/25/2020 0841   VLDL 14.2 02/25/2020 0841   LDLCALC 114 (H) 02/25/2020 0841    CBC    Component Value Date/Time   WBC 9.5 11/11/2019 1516   RBC 4.12 11/11/2019 1516   HGB 12.5 11/11/2019 1516   HCT 36.9 11/11/2019 1516   PLT 368.0 11/11/2019 1516   MCV 89.5 11/11/2019 1516   MCHC 33.9 11/11/2019 1516   RDW 14.9 11/11/2019 1516    Hgb A1C No results found for: HGBA1C    Assessment and Plan:   Medicare Annual Wellness Visit:  Diet:  Physical activity: Sedentary Depression/mood screen: Negative, PHQ 9 score of Hearing: Intact to whispered voice Visual acuity: Grossly normal, performs annual eye exam  ADLs: Capable Fall risk: None Home safety: Good Cognitive evaluation: Intact to orientation, naming, recall and repetition EOL planning: Adv directives, DNR/ I agree  Preventative Medicine:    Next appointment: 1 year, Medicare Wellness Exam   Webb Silversmith, NP This visit occurred during the SARS-CoV-2 public health emergency.  Safety protocols were in place, including screening questions prior to the visit, additional usage of staff PPE, and extensive cleaning of exam room while observing appropriate contact time as indicated for disinfecting  solutions.

## 2021-02-02 ENCOUNTER — Telehealth: Payer: Self-pay | Admitting: Internal Medicine

## 2021-02-02 DIAGNOSIS — F320 Major depressive disorder, single episode, mild: Secondary | ICD-10-CM

## 2021-02-02 DIAGNOSIS — E7800 Pure hypercholesterolemia, unspecified: Secondary | ICD-10-CM

## 2021-02-02 NOTE — Telephone Encounter (Signed)
Pt called in wanted to know about getting a refill on all her meds.

## 2021-02-07 DIAGNOSIS — H353113 Nonexudative age-related macular degeneration, right eye, advanced atrophic without subfoveal involvement: Secondary | ICD-10-CM | POA: Diagnosis not present

## 2021-02-07 DIAGNOSIS — H442E3 Degenerative myopia with other maculopathy, bilateral eye: Secondary | ICD-10-CM | POA: Diagnosis not present

## 2021-02-07 DIAGNOSIS — H353223 Exudative age-related macular degeneration, left eye, with inactive scar: Secondary | ICD-10-CM | POA: Diagnosis not present

## 2021-02-07 DIAGNOSIS — H15833 Staphyloma posticum, bilateral: Secondary | ICD-10-CM | POA: Diagnosis not present

## 2021-02-09 ENCOUNTER — Encounter: Payer: Self-pay | Admitting: Internal Medicine

## 2021-02-09 DIAGNOSIS — J029 Acute pharyngitis, unspecified: Secondary | ICD-10-CM

## 2021-02-09 DIAGNOSIS — K2100 Gastro-esophageal reflux disease with esophagitis, without bleeding: Secondary | ICD-10-CM

## 2021-02-09 DIAGNOSIS — R109 Unspecified abdominal pain: Secondary | ICD-10-CM

## 2021-02-09 MED ORDER — OMEPRAZOLE 20 MG PO CPDR: DELAYED_RELEASE_CAPSULE | ORAL | 0 refills | Status: DC

## 2021-02-09 MED ORDER — ATORVASTATIN CALCIUM 10 MG PO TABS: 10.0000 mg | ORAL_TABLET | Freq: Every day | ORAL | 0 refills | Status: DC

## 2021-02-09 MED ORDER — SERTRALINE HCL 50 MG PO TABS: 50.0000 mg | ORAL_TABLET | Freq: Every day | ORAL | 0 refills | Status: DC

## 2021-02-13 ENCOUNTER — Encounter: Payer: Medicare Other | Admitting: Internal Medicine

## 2021-03-30 DIAGNOSIS — Z23 Encounter for immunization: Secondary | ICD-10-CM | POA: Diagnosis not present

## 2021-04-10 ENCOUNTER — Encounter: Payer: Medicare Other | Admitting: Internal Medicine

## 2021-04-17 ENCOUNTER — Encounter: Payer: Self-pay | Admitting: Internal Medicine

## 2021-04-17 NOTE — Telephone Encounter (Signed)
I can not see her today but you can change her AWV for tomorrow to a virtual sick visit

## 2021-04-18 ENCOUNTER — Telehealth (INDEPENDENT_AMBULATORY_CARE_PROVIDER_SITE_OTHER): Payer: Medicare Other | Admitting: Internal Medicine

## 2021-04-18 ENCOUNTER — Encounter: Payer: Self-pay | Admitting: Internal Medicine

## 2021-04-18 ENCOUNTER — Other Ambulatory Visit: Payer: Self-pay

## 2021-04-18 DIAGNOSIS — J329 Chronic sinusitis, unspecified: Secondary | ICD-10-CM

## 2021-04-18 DIAGNOSIS — B9789 Other viral agents as the cause of diseases classified elsewhere: Secondary | ICD-10-CM | POA: Diagnosis not present

## 2021-04-18 MED ORDER — PREDNISONE 10 MG PO TABS: ORAL_TABLET | ORAL | 0 refills | Status: DC

## 2021-04-18 NOTE — Patient Instructions (Signed)

## 2021-04-18 NOTE — Progress Notes (Signed)
Virtual Visit via Video Note  I connected with Terri Crawford on 04/18/21 at 10:00 AM EDT by a video enabled telemedicine application and verified that I am speaking with the correct person using two identifiers.  Location: Patient: Home Provider: Office  Person's participating in this video call: Webb Silversmith, NP-C and [name].   I discussed the limitations of evaluation and management by telemedicine and the availability of in person appointments. The patient expressed understanding and agreed to proceed.  History of Present Illness:  Pt reports headache, nasal congestion, scratchy throat, cough and chest congestion.  She reports this started 4 days ago.  The headache is located in her forehead.  She describes the pain as pressure.  She is blowing some light-colored mucus out of her nose.  She denies difficulty swallowing.  The cough is productive of light-colored mucus as well.  She reports fever up to 101 yesterday but denies chills or body aches.  She denies runny nose, ear pain, loss of taste/smell, shortness of breath, nausea, vomiting or diarrhea.  She has tried her allergy medicine, Tylenol cold and sinus and DayQuil severe with minimal relief of symptoms.  She has not had sick contacts or exposure to COVID that she is aware of.  She has had her COVID vaccines.   Past Medical History:  Diagnosis Date  . Allergy   . Basal cell carcinoma   . Chicken pox   . GERD (gastroesophageal reflux disease)   . Heart murmur   . History of blood transfusion   . History of stomach ulcers   . Hx of adenomatous polyp of colon 09/05/2017  . Post-operative nausea and vomiting    "slow to wake up"    Current Outpatient Medications  Medication Sig Dispense Refill  . atorvastatin (LIPITOR) 10 MG tablet Take 1 tablet (10 mg total) by mouth daily. 90 tablet 0  . cetirizine (ZYRTEC) 10 MG tablet Take 10 mg by mouth daily.    . Multiple Vitamins-Minerals (VISION FORMULA EYE HEALTH) CAPS  Take 2 capsules by mouth daily.    . Multiple Vitamins-Minerals (WOMENS MULTIVITAMIN PO) Take 1 tablet by mouth daily.    Marland Kitchen omeprazole (PRILOSEC) 20 MG capsule TAKE 1 CAPSULE BY MOUTH EVERY DAY 90 capsule 0  . Polyethyl Glycol-Propyl Glycol (SYSTANE OP) Apply 1 drop to eye 2 (two) times daily.     . sertraline (ZOLOFT) 50 MG tablet Take 1 tablet (50 mg total) by mouth daily. 90 tablet 0   No current facility-administered medications for this visit.    Allergies  Allergen Reactions  . Phenobarbital Other (See Comments)    syncope  . Codeine Nausea And Vomiting    Family History  Problem Relation Age of Onset  . Arthritis Mother   . Uterine cancer Mother   . Diabetes Mother   . Heart disease Father   . Cancer Maternal Uncle   . Throat cancer Maternal Grandmother   . Diabetes Maternal Grandmother   . Prostate cancer Maternal Grandfather   . Colon cancer Neg Hx     Social History   Socioeconomic History  . Marital status: Divorced    Spouse name: Not on file  . Number of children: Not on file  . Years of education: Not on file  . Highest education level: Not on file  Occupational History  . Not on file  Tobacco Use  . Smoking status: Former Research scientist (life sciences)  . Smokeless tobacco: Never Used  Vaping Use  . Vaping Use:  Never used  Substance and Sexual Activity  . Alcohol use: No  . Drug use: No  . Sexual activity: Not Currently  Other Topics Concern  . Not on file  Social History Narrative  . Not on file   Social Determinants of Health   Financial Resource Strain: Not on file  Food Insecurity: Not on file  Transportation Needs: Not on file  Physical Activity: Not on file  Stress: Not on file  Social Connections: Not on file  Intimate Partner Violence: Not on file     Constitutional: Patient reports headache, fever.  Denies malaise, fatigue, or abrupt weight changes.  HEENT: Patient reports nasal congestion, scratchy throat.  Denies eye pain, eye redness, ear pain,  ringing in the ears, wax buildup, runny nose, bloody nose, or sore throat. Respiratory: Patient reports cough.  Denies difficulty breathing, shortness of breath, cough.   Cardiovascular: Denies chest pain, chest tightness, palpitations or swelling in the hands or feet.  Gastrointestinal: Denies abdominal pain, bloating, constipation, diarrhea or blood in the stool.  Neurological: Denies dizziness, difficulty with memory, difficulty with speech or problems with balance and coordination.    No other specific complaints in a complete review of systems (except as listed in HPI above).    Observations/Objective:  Wt Readings from Last 3 Encounters:  07/06/20 220 lb 4 oz (99.9 kg)  11/11/19 222 lb (100.7 kg)  07/29/19 213 lb (96.6 kg)    General: Appears her stated age, obese, in NAD. Skin: Dry and intact. HEENT: Head: normal shape and size; Nose: Congestion noted; Throat/Mouth: Hoarseness noted Pulmonary/Chest: Normal effort.  Dry cough noted.  No respiratory distress.  Neurological: Alert and oriented.  Psych: Mood and affect normal.   BMET    Component Value Date/Time   NA 134 (L) 02/25/2020 0841   K 4.1 02/25/2020 0841   CL 98 02/25/2020 0841   CO2 26 02/25/2020 0841   GLUCOSE 113 (H) 02/25/2020 0841   BUN 19 02/25/2020 0841   CREATININE 0.56 02/25/2020 0841   CALCIUM 9.9 02/25/2020 0841    Lipid Panel     Component Value Date/Time   CHOL 227 (H) 02/25/2020 0841   TRIG 71.0 02/25/2020 0841   HDL 98.90 02/25/2020 0841   CHOLHDL 2 02/25/2020 0841   VLDL 14.2 02/25/2020 0841   LDLCALC 114 (H) 02/25/2020 0841    CBC    Component Value Date/Time   WBC 9.5 11/11/2019 1516   RBC 4.12 11/11/2019 1516   HGB 12.5 11/11/2019 1516   HCT 36.9 11/11/2019 1516   PLT 368.0 11/11/2019 1516   MCV 89.5 11/11/2019 1516   MCHC 33.9 11/11/2019 1516   RDW 14.9 11/11/2019 1516    Hgb A1C No results found for: HGBA1C      Assessment and Plan:  Viral Sinusitis:  Continue  allergy medicine OTC Can use a Nettie pot/sinus rinse which can be purchased from your local pharmacy Rx for Prednisone taper x6 days If no improvement by Monday, will consider Augmentin 875-125 mg p.o. twice daily x10 days  Return precautions discussed  Follow Up Instructions:    I discussed the assessment and treatment plan with the patient. The patient was provided an opportunity to ask questions and all were answered. The patient agreed with the plan and demonstrated an understanding of the instructions.   The patient was advised to call back or seek an in-person evaluation if the symptoms worsen or if the condition fails to improve as anticipated.  Webb Silversmith, NP

## 2021-04-23 ENCOUNTER — Encounter: Payer: Self-pay | Admitting: Internal Medicine

## 2021-05-03 ENCOUNTER — Encounter: Payer: Self-pay | Admitting: Internal Medicine

## 2021-05-03 ENCOUNTER — Ambulatory Visit (INDEPENDENT_AMBULATORY_CARE_PROVIDER_SITE_OTHER): Payer: Medicare Other | Admitting: Internal Medicine

## 2021-05-03 ENCOUNTER — Other Ambulatory Visit: Payer: Self-pay

## 2021-05-03 VITALS — BP 149/57 | HR 73 | Ht 63.5 in | Wt 206.5 lb

## 2021-05-03 DIAGNOSIS — K2100 Gastro-esophageal reflux disease with esophagitis, without bleeding: Secondary | ICD-10-CM | POA: Diagnosis not present

## 2021-05-03 DIAGNOSIS — E6609 Other obesity due to excess calories: Secondary | ICD-10-CM | POA: Diagnosis not present

## 2021-05-03 DIAGNOSIS — Z6836 Body mass index (BMI) 36.0-36.9, adult: Secondary | ICD-10-CM

## 2021-05-03 DIAGNOSIS — Z0000 Encounter for general adult medical examination without abnormal findings: Secondary | ICD-10-CM | POA: Diagnosis not present

## 2021-05-03 DIAGNOSIS — E785 Hyperlipidemia, unspecified: Secondary | ICD-10-CM | POA: Diagnosis not present

## 2021-05-03 DIAGNOSIS — R030 Elevated blood-pressure reading, without diagnosis of hypertension: Secondary | ICD-10-CM | POA: Diagnosis not present

## 2021-05-03 DIAGNOSIS — F320 Major depressive disorder, single episode, mild: Secondary | ICD-10-CM

## 2021-05-03 NOTE — Progress Notes (Signed)
HPI:  Pt presents to the clinic today for her subsequent annual Medicare Wellness Exam. She is also due to follow up chronic conditions.  GERD: She denies breakthrough on Omeprazole. There is no upper GI on file.  HLD: Her last LDL was 114, triglycerides 71, 02/2020. She denies myalgias on Atorvastatin. She tries to consume a low fat diet.  Depression: Chronic, managed on Sertraline. She is seeing a therapist. She denies anxiety, SI/HI.  Elevated Blood Pressures without Diagnosis of HTN: Her BP today is 149/57. She does not check her BP at home. She is not taking any antihypertensive medications. ECG from 06/2017 reviewed.  Past Medical History:  Diagnosis Date  . Allergy   . Basal cell carcinoma   . Chicken pox   . GERD (gastroesophageal reflux disease)   . Heart murmur   . History of blood transfusion   . History of stomach ulcers   . Hx of adenomatous polyp of colon 09/05/2017  . Post-operative nausea and vomiting    "slow to wake up"    Current Outpatient Medications  Medication Sig Dispense Refill  . atorvastatin (LIPITOR) 10 MG tablet Take 1 tablet (10 mg total) by mouth daily. 90 tablet 0  . cetirizine (ZYRTEC) 10 MG tablet Take 10 mg by mouth daily.    . Multiple Vitamins-Minerals (VISION FORMULA EYE HEALTH) CAPS Take 2 capsules by mouth daily.    . Multiple Vitamins-Minerals (WOMENS MULTIVITAMIN PO) Take 1 tablet by mouth daily.    Marland Kitchen omeprazole (PRILOSEC) 20 MG capsule TAKE 1 CAPSULE BY MOUTH EVERY DAY 90 capsule 0  . Phenylephrine-APAP-guaiFENesin (SEVERE SINUS CONGESTION & PAIN PO) Take by mouth.    Vladimir Faster Glycol-Propyl Glycol (SYSTANE OP) Apply 1 drop to eye 2 (two) times daily.     . predniSONE (DELTASONE) 10 MG tablet Take 3 tabs on days 1-2, take 2 tabs on days 3-4, take 1 tab on days 5-6 12 tablet 0  . sertraline (ZOLOFT) 50 MG tablet Take 1 tablet (50 mg total) by mouth daily. 90 tablet 0   No current facility-administered medications for this visit.     Allergies  Allergen Reactions  . Phenobarbital Other (See Comments)    syncope  . Codeine Nausea And Vomiting    Family History  Problem Relation Age of Onset  . Arthritis Mother   . Uterine cancer Mother   . Diabetes Mother   . Heart disease Father   . Cancer Maternal Uncle   . Throat cancer Maternal Grandmother   . Diabetes Maternal Grandmother   . Prostate cancer Maternal Grandfather   . Colon cancer Neg Hx     Social History   Socioeconomic History  . Marital status: Divorced    Spouse name: Not on file  . Number of children: Not on file  . Years of education: Not on file  . Highest education level: Not on file  Occupational History  . Not on file  Tobacco Use  . Smoking status: Former Research scientist (life sciences)  . Smokeless tobacco: Never Used  Vaping Use  . Vaping Use: Never used  Substance and Sexual Activity  . Alcohol use: No  . Drug use: No  . Sexual activity: Not Currently  Other Topics Concern  . Not on file  Social History Narrative  . Not on file   Social Determinants of Health   Financial Resource Strain: Not on file  Food Insecurity: Not on file  Transportation Needs: Not on file  Physical Activity: Not on file  Stress: Not on file  Social Connections: Not on file  Intimate Partner Violence: Not on file    Hospitiliaztions: None  Health Maintenance:    Flu: 10/2019  Tetanus: > 10 years  Pneumovax: 10/2019  Prevnar: 02/2017  Zostavax: never  Shingrix: 09/2020  Covid: Pfizer x 4  Mammogram: more than 5 years ago  Pap Smear: more than 5 years ago  Bone Density: more than 5 years ago  Colon Screening: 08/2017  Eye Doctor: annually  Dental Exam: as needed   Providers:   PCP: Webb Silversmith, NP   Retina Specialist: Dr. Baird Cancer  I have personally reviewed and have noted:  1. The patient's medical and social history 2. Their use of alcohol, tobacco or illicit drugs 3. Their current medications and supplements 4. The patient's functional ability  including ADL's, fall risks, home safety risks and hearing or visual impairment. 5. Diet and physical activities 6. Evidence for depression or mood disorder  Subjective:   Review of Systems:   Constitutional: Denies fever, malaise, fatigue, headache or abrupt weight changes.  HEENT: Denies eye pain, eye redness, ear pain, ringing in the ears, wax buildup, runny nose, nasal congestion, bloody nose, or sore throat. Respiratory: Denies difficulty breathing, shortness of breath, cough or sputum production.   Cardiovascular: Denies chest pain, chest tightness, palpitations or swelling in the hands or feet.  Gastrointestinal: Denies abdominal pain, bloating, constipation, diarrhea or blood in the stool.  GU: Denies urgency, frequency, pain with urination, burning sensation, blood in urine, odor or discharge. Musculoskeletal: Denies decrease in range of motion, difficulty with gait, muscle pain or joint pain and swelling.  Skin: Denies redness, rashes, lesions or ulcercations.  Neurological: Denies dizziness, difficulty with memory, difficulty with speech or problems with balance and coordination.  Psych: Pt has a history of depression. Denies anxiety, SI/HI.  No other specific complaints in a complete review of systems (except as listed in HPI above).  Objective:  PE:   BP (!) 149/57   Pulse 73   Ht 5' 3.5" (1.613 m)   Wt 206 lb 8 oz (93.7 kg)   SpO2 100%   BMI 36.01 kg/m   Wt Readings from Last 3 Encounters:  07/06/20 220 lb 4 oz (99.9 kg)  11/11/19 222 lb (100.7 kg)  07/29/19 213 lb (96.6 kg)    General: Appears her stated age, obese, in NAD. Skin: Warm, dry and intact. No rashes noted. HEENT: Head: normal shape and size; Eyes: sclera white, no icterus, conjunctiva pink, PERRLA and EOMs intact; Neck: Neck supple, trachea midline. No masses, lumps or thyromegaly present.  Cardiovascular: Normal rate and rhythm. S1,S2 noted.  No murmur, rubs or gallops noted. No JVD or BLE edema.  No carotid bruits noted. Pulmonary/Chest: Normal effort and positive vesicular breath sounds. No respiratory distress. No wheezes, rales or ronchi noted.  Abdomen: Soft and nontender. Normal bowel sounds. No distention or masses noted. Liver, spleen and kidneys non palpable. Musculoskeletal: Strength 5/5 BUE/BLE. No signs of joint swelling.  Neurological: Alert and oriented. Cranial nerves II-XII grossly intact. Coordination normal.  Psychiatric: Mood and affect normal. Behavior is normal. Judgment and thought content normal.   BMET    Component Value Date/Time   NA 134 (L) 02/25/2020 0841   K 4.1 02/25/2020 0841   CL 98 02/25/2020 0841   CO2 26 02/25/2020 0841   GLUCOSE 113 (H) 02/25/2020 0841   BUN 19 02/25/2020 0841   CREATININE 0.56 02/25/2020 0841   CALCIUM 9.9 02/25/2020 0841  Lipid Panel     Component Value Date/Time   CHOL 227 (H) 02/25/2020 0841   TRIG 71.0 02/25/2020 0841   HDL 98.90 02/25/2020 0841   CHOLHDL 2 02/25/2020 0841   VLDL 14.2 02/25/2020 0841   LDLCALC 114 (H) 02/25/2020 0841    CBC    Component Value Date/Time   WBC 9.5 11/11/2019 1516   RBC 4.12 11/11/2019 1516   HGB 12.5 11/11/2019 1516   HCT 36.9 11/11/2019 1516   PLT 368.0 11/11/2019 1516   MCV 89.5 11/11/2019 1516   MCHC 33.9 11/11/2019 1516   RDW 14.9 11/11/2019 1516    Hgb A1C No results found for: HGBA1C    Assessment and Plan:   Medicare Annual Wellness Visit:  Diet:  She does eat meat. She consumes more veggies than fruits. She tries to avoid fried foods. She drinks mostly water, tea, coffee and Dt. Soda. Physical activity: [employer] 2-3 times per week Depression/mood screen: Negative, PHQ 9 score of 3 Hearing: Intact to whispered voice Visual acuity: Grossly normal, performs annual eye exam  ADLs: Capable Fall risk: None Home safety: Good Cognitive evaluation: Intact to orientation, naming, recall and repetition EOL planning: Adv directives, full code/ I  agree  Preventative Medicine: Encouraged her to get a flu shot in the fall. She declines tetanus for financial reasons. Advised her if she gets cut or bit, she will need to get a tetanus. Pneumovax, prevnar and covid UTD. She will get her 2nd shingrix at the pharmacy. She declines mammogram, pap smear or bone density exam. Colon screening UTD. Encouraged her to consume a balanced diet and exercise regimen. Advised her to see an eye doctor and dentist annually. Will check CBC, CMET and Lipid profile. Due dates for screening exam given to patient as part of her AVS.   Next appointment: 1 year, Medicare Wellness Exam  Webb Silversmith, NP This visit occurred during the SARS-CoV-2 public health emergency.  Safety protocols were in place, including screening questions prior to the visit, additional usage of staff PPE, and extensive cleaning of exam room while observing appropriate contact time as indicated for disinfecting solutions.

## 2021-05-03 NOTE — Assessment & Plan Note (Signed)
Will try weaning Sertraline Support offered

## 2021-05-03 NOTE — Assessment & Plan Note (Signed)
Continue Omeprazole CBC and CMET today Avoid foods that trigger your reflux Encouraged weight loss as this can reduce reflux symptoms

## 2021-05-03 NOTE — Patient Instructions (Signed)

## 2021-05-03 NOTE — Assessment & Plan Note (Signed)
Reinforced DASH diet and exercise for weight loss Will monitor

## 2021-05-04 ENCOUNTER — Encounter: Payer: Self-pay | Admitting: Internal Medicine

## 2021-05-04 ENCOUNTER — Other Ambulatory Visit: Payer: Self-pay

## 2021-05-04 DIAGNOSIS — K2100 Gastro-esophageal reflux disease with esophagitis, without bleeding: Secondary | ICD-10-CM

## 2021-05-04 DIAGNOSIS — J029 Acute pharyngitis, unspecified: Secondary | ICD-10-CM

## 2021-05-04 DIAGNOSIS — R109 Unspecified abdominal pain: Secondary | ICD-10-CM

## 2021-05-04 LAB — COMPREHENSIVE METABOLIC PANEL
AG Ratio: 1.7 (calc) (ref 1.0–2.5)
ALT: 12 U/L (ref 6–29)
AST: 12 U/L (ref 10–35)
Albumin: 4.4 g/dL (ref 3.6–5.1)
Alkaline phosphatase (APISO): 72 U/L (ref 37–153)
BUN/Creatinine Ratio: 27 (calc) — ABNORMAL HIGH (ref 6–22)
BUN: 13 mg/dL (ref 7–25)
CO2: 27 mmol/L (ref 20–32)
Calcium: 10.1 mg/dL (ref 8.6–10.4)
Chloride: 97 mmol/L — ABNORMAL LOW (ref 98–110)
Creat: 0.49 mg/dL — ABNORMAL LOW (ref 0.50–0.99)
Globulin: 2.6 g/dL (calc) (ref 1.9–3.7)
Glucose, Bld: 86 mg/dL (ref 65–99)
Potassium: 4.3 mmol/L (ref 3.5–5.3)
Sodium: 134 mmol/L — ABNORMAL LOW (ref 135–146)
Total Bilirubin: 0.4 mg/dL (ref 0.2–1.2)
Total Protein: 7 g/dL (ref 6.1–8.1)

## 2021-05-04 LAB — LIPID PANEL
Cholesterol: 210 mg/dL — ABNORMAL HIGH (ref <200)
HDL: 76 mg/dL (ref >=50)
LDL Cholesterol (Calc): 113 mg/dL (calc) — ABNORMAL HIGH
Non-HDL Cholesterol (Calc): 134 mg/dL (calc) — ABNORMAL HIGH (ref <130)
Total CHOL/HDL Ratio: 2.8 (calc) (ref <5.0)
Triglycerides: 105 mg/dL (ref <150)

## 2021-05-04 LAB — CBC
HCT: 34.6 % — ABNORMAL LOW (ref 35.0–45.0)
Hemoglobin: 11 g/dL — ABNORMAL LOW (ref 11.7–15.5)
MCH: 27.5 pg (ref 27.0–33.0)
MCHC: 31.8 g/dL — ABNORMAL LOW (ref 32.0–36.0)
MCV: 86.5 fL (ref 80.0–100.0)
MPV: 10.4 fL (ref 7.5–12.5)
Platelets: 407 10*3/uL — ABNORMAL HIGH (ref 140–400)
RBC: 4 10*6/uL (ref 3.80–5.10)
RDW: 13.1 % (ref 11.0–15.0)
WBC: 8.7 10*3/uL (ref 3.8–10.8)

## 2021-05-04 MED ORDER — OMEPRAZOLE 20 MG PO CPDR: DELAYED_RELEASE_CAPSULE | ORAL | 0 refills | Status: DC

## 2021-05-04 NOTE — Telephone Encounter (Signed)
Please review.  KP

## 2021-05-07 ENCOUNTER — Encounter: Payer: Self-pay | Admitting: Internal Medicine

## 2021-05-07 NOTE — Telephone Encounter (Signed)
[place] with me. We have documentation that she requested [place] to fax it. Just make sure to use a cover sheet.

## 2021-05-07 NOTE — Telephone Encounter (Signed)
Labs have been faxed to the number provided.   Thanks,   -Rosalena Mccorry  

## 2021-05-16 ENCOUNTER — Other Ambulatory Visit: Payer: Self-pay | Admitting: Internal Medicine

## 2021-05-16 DIAGNOSIS — F320 Major depressive disorder, single episode, mild: Secondary | ICD-10-CM

## 2021-05-16 NOTE — Telephone Encounter (Signed)
Requested Prescriptions  Pending Prescriptions Disp Refills  . sertraline (ZOLOFT) 50 MG tablet [Pharmacy Med Name: SERTRALINE HCL 50 MG TABLET] 90 tablet 1    Sig: TAKE 1 TABLET BY MOUTH EVERY DAY     There is no refill protocol information for this order

## 2021-06-12 ENCOUNTER — Other Ambulatory Visit: Payer: Self-pay | Admitting: Internal Medicine

## 2021-06-12 DIAGNOSIS — E7800 Pure hypercholesterolemia, unspecified: Secondary | ICD-10-CM

## 2021-08-02 ENCOUNTER — Encounter: Payer: Self-pay | Admitting: Internal Medicine

## 2021-08-10 ENCOUNTER — Encounter: Payer: Self-pay | Admitting: Internal Medicine

## 2021-08-10 DIAGNOSIS — J029 Acute pharyngitis, unspecified: Secondary | ICD-10-CM

## 2021-08-10 DIAGNOSIS — R109 Unspecified abdominal pain: Secondary | ICD-10-CM

## 2021-08-10 DIAGNOSIS — K2100 Gastro-esophageal reflux disease with esophagitis, without bleeding: Secondary | ICD-10-CM

## 2021-08-10 MED ORDER — OMEPRAZOLE 20 MG PO CPDR: DELAYED_RELEASE_CAPSULE | ORAL | 0 refills | Status: DC

## 2021-08-10 NOTE — Telephone Encounter (Signed)
I have refilled the omeprazole.  I would recommend she follow-up with me concerning her anemia.

## 2021-08-13 MED ORDER — OMEPRAZOLE 20 MG PO CPDR: DELAYED_RELEASE_CAPSULE | ORAL | 0 refills | Status: DC

## 2021-08-13 NOTE — Addendum Note (Signed)
Addended by: Wilson Singer on: 08/13/2021 11:57 AM   Modules accepted: Orders

## 2021-08-16 ENCOUNTER — Ambulatory Visit (INDEPENDENT_AMBULATORY_CARE_PROVIDER_SITE_OTHER): Payer: Medicare Other | Admitting: Internal Medicine

## 2021-08-16 ENCOUNTER — Encounter: Payer: Self-pay | Admitting: Internal Medicine

## 2021-08-16 ENCOUNTER — Other Ambulatory Visit: Payer: Self-pay

## 2021-08-16 VITALS — BP 131/50 | HR 71 | Temp 97.5°F | Resp 17 | Ht 63.5 in | Wt 203.2 lb

## 2021-08-16 DIAGNOSIS — D649 Anemia, unspecified: Secondary | ICD-10-CM | POA: Diagnosis not present

## 2021-08-16 DIAGNOSIS — E6609 Other obesity due to excess calories: Secondary | ICD-10-CM | POA: Insufficient documentation

## 2021-08-16 DIAGNOSIS — E785 Hyperlipidemia, unspecified: Secondary | ICD-10-CM | POA: Insufficient documentation

## 2021-08-16 LAB — POCT URINALYSIS DIPSTICK
Bilirubin, UA: NEGATIVE
Blood, UA: NEGATIVE
Glucose, UA: NEGATIVE
Ketones, UA: NEGATIVE
Leukocytes, UA: NEGATIVE
Nitrite, UA: NEGATIVE
Protein, UA: NEGATIVE
Spec Grav, UA: <=1.005 — AB (ref 1.010–1.025)
Urobilinogen, UA: 0.2 E.U./dL
pH, UA: 6 (ref 5.0–8.0)

## 2021-08-16 NOTE — Assessment & Plan Note (Signed)
We will check CBC with differential and iron panel today Insurance would not cover B12 level Urinalysis negative for blood

## 2021-08-16 NOTE — Progress Notes (Signed)
Subjective:    Patient ID: Terri Crawford, female    DOB: [birth year], 70 y.o.   MRN: [id number]  HPI  Patient presents to clinic today for follow-up of anemia.  She reports she tried to go donate blood in her hemoglobin was too low for donation.  Her last H/H in epic was 11.0/34.6, 04/2021.  She has been taking a multivitamin with iron in it.  She has not noticed any signs or symptoms of bleeding. She has been feeling fatigued, but denies cold intolerance or shortness of breath.  She typically tries to donate blood every 8 weeks.  Review of Systems  Past Medical History:  Diagnosis Date   Allergy    Basal cell carcinoma    Chicken pox    GERD (gastroesophageal reflux disease)    Heart murmur    History of blood transfusion    History of stomach ulcers    Hx of adenomatous polyp of colon 09/05/2017   Post-operative nausea and vomiting    "slow to wake up"    Current Outpatient Medications  Medication Sig Dispense Refill   atorvastatin (LIPITOR) 10 MG tablet TAKE 1 TABLET BY MOUTH EVERY DAY 90 tablet 3   cetirizine (ZYRTEC) 10 MG tablet Take 10 mg by mouth daily.     latanoprost (XALATAN) 0.005 % ophthalmic solution 1 drop at bedtime.     Multiple Vitamins-Minerals (VISION FORMULA EYE HEALTH) CAPS Take 2 capsules by mouth daily.     Multiple Vitamins-Minerals (WOMENS MULTIVITAMIN PO) Take 1 tablet by mouth daily.     omeprazole (PRILOSEC) 20 MG capsule TAKE 1 CAPSULE BY MOUTH EVERY DAY 90 capsule 0   Phenylephrine-APAP-guaiFENesin (SEVERE SINUS CONGESTION & PAIN PO) Take by mouth.     Polyethyl Glycol-Propyl Glycol (SYSTANE OP) Apply 1 drop to eye 2 (two) times daily.      sertraline (ZOLOFT) 50 MG tablet TAKE 1 TABLET BY MOUTH EVERY DAY 90 tablet 1   No current facility-administered medications for this visit.    Allergies  Allergen Reactions   Phenobarbital Other (See Comments)    syncope   Codeine Nausea And Vomiting    Family History  Problem Relation Age of Onset    Arthritis Mother    Uterine cancer Mother    Diabetes Mother    Heart disease Father    Cancer Maternal Uncle    Throat cancer Maternal Grandmother    Diabetes Maternal Grandmother    Prostate cancer Maternal Grandfather    Colon cancer Neg Hx     Social History   Socioeconomic History   Marital status: Divorced    Spouse name: Not on file   Number of children: Not on file   Years of education: Not on file   Highest education level: Not on file  Occupational History   Not on file  Tobacco Use   Smoking status: Former   Smokeless tobacco: Never  Vaping Use   Vaping Use: Never used  Substance and Sexual Activity   Alcohol use: No   Drug use: No   Sexual activity: Not Currently  Other Topics Concern   Not on file  Social History Narrative   Not on file   Social Determinants of Health   Financial Resource Strain: Not on file  Food Insecurity: Not on file  Transportation Needs: Not on file  Physical Activity: Not on file  Stress: Not on file  Social Connections: Not on file  Intimate Partner Violence: Not on file  Constitutional: Patient reports fatigue.  Denies fever, malaise, headache or abrupt weight changes.  Respiratory: Denies difficulty breathing, shortness of breath, cough or sputum production.   Cardiovascular: Denies chest pain, chest tightness, palpitations or swelling in the hands or feet.  Gastrointestinal: Denies abdominal pain, bloating, constipation, diarrhea or blood in the stool.  GU: Denies urgency, frequency, pain with urination, burning sensation, blood in urine, odor or discharge. Skin: Denies redness, rashes, lesions or ulcercations.   No other specific complaints in a complete review of systems (except as listed in HPI above).     Objective:   Physical Exam BP (!) 131/50 (BP Location: Right Arm, Patient Position: Sitting, Cuff Size: Large)   Pulse 71   Temp (!) 97.5 F (36.4 C) (Temporal)   Resp 17   Ht 5' 3.5" (1.613 m)   Wt  203 lb 3.2 oz (92.2 kg)   SpO2 98%   BMI 35.43 kg/m   Wt Readings from Last 3 Encounters:  05/03/21 206 lb 8 oz (93.7 kg)  07/06/20 220 lb 4 oz (99.9 kg)  11/11/19 222 lb (100.7 kg)    General: Appears her stated age, obese, in NAD. Skin: Warm, dry and intact. No bruising noted. HEENT: Head: normal shape and size;  Cardiovascular: Normal rate and rhythm. S1,S2 noted.  No murmur, rubs or gallops noted.  Pulmonary/Chest: Normal effort and positive vesicular breath sounds. No respiratory distress. No wheezes, rales or ronchi noted.  Rectal: Hemoccult negative stool. Musculoskeletal: No difficulty with gait.  Neurological: Alert and oriented.     BMET    Component Value Date/Time   NA 134 (L) 05/03/2021 1334   K 4.3 05/03/2021 1334   CL 97 (L) 05/03/2021 1334   CO2 27 05/03/2021 1334   GLUCOSE 86 05/03/2021 1334   BUN 13 05/03/2021 1334   CREATININE 0.49 (L) 05/03/2021 1334   CALCIUM 10.1 05/03/2021 1334    Lipid Panel     Component Value Date/Time   CHOL 210 (H) 05/03/2021 1334   TRIG 105 05/03/2021 1334   HDL 76 05/03/2021 1334   CHOLHDL 2.8 05/03/2021 1334   VLDL 14.2 02/25/2020 0841   LDLCALC 113 (H) 05/03/2021 1334    CBC    Component Value Date/Time   WBC 8.7 05/03/2021 1334   RBC 4.00 05/03/2021 1334   HGB 11.0 (L) 05/03/2021 1334   HCT 34.6 (L) 05/03/2021 1334   PLT 407 (H) 05/03/2021 1334   MCV 86.5 05/03/2021 1334   MCH 27.5 05/03/2021 1334   MCHC 31.8 (L) 05/03/2021 1334   RDW 13.1 05/03/2021 1334    Hgb A1C No results found for: HGBA1C          Assessment & Plan:   Webb Silversmith, NP This visit occurred during the SARS-CoV-2 public health emergency.  Safety protocols were in place, including screening questions prior to the visit, additional usage of staff PPE, and extensive cleaning of exam room while observing appropriate contact time as indicated for disinfecting solutions.

## 2021-08-16 NOTE — Patient Instructions (Signed)
[employer] medicine (25th ed., pp. 1059-1068). [place], [place]: Elsevier.">  Anemia  Anemia is a condition in which there is not enough red blood cells or hemoglobin in the blood. Hemoglobin is a substance in red blood cells thatcarries oxygen. When you do not have enough red blood cells or hemoglobin (are anemic), your body cannot get enough oxygen and your organs may not work properly. Asa result, you may feel very tired or have other problems. What are the causes? Common causes of anemia include: Excessive bleeding. Anemia can be caused by excessive bleeding inside or outside the body, including bleeding from the intestines or from heavy menstrual periods in females. Poor nutrition. Long-lasting (chronic) kidney, thyroid, and liver disease. Bone marrow disorders, spleen problems, and blood disorders. Cancer and treatments for cancer. HIV (human immunodeficiency virus) and AIDS (acquired immunodeficiency syndrome). Infections, medicines, and autoimmune disorders that destroy red blood cells. What are the signs or symptoms? Symptoms of this condition include: Minor weakness. Dizziness. Headache, or difficulties concentrating and sleeping. Heartbeats that feel irregular or faster than normal (palpitations). Shortness of breath, especially with exercise. Pale skin, lips, and nails, or cold hands and feet. Indigestion and nausea. Symptoms may occur suddenly or develop slowly. If your anemia is mild, you maynot have symptoms. How is this diagnosed? This condition is diagnosed based on blood tests, your medical history, and a physical exam. In some cases, a test may be needed in which cells are removed from the soft tissue inside of a bone and looked at under a microscope (bone marrow biopsy). Your health care provider may also check your stool (feces) for blood and may do additional testing to look for the cause of yourbleeding. Other tests may include: Imaging tests, such as a CT scan or  MRI. A procedure to see inside your esophagus and stomach (endoscopy). A procedure to see inside your colon and rectum (colonoscopy). How is this treated? Treatment for this condition depends on the cause. If you continue to lose a lot of blood, you may need to be treated at a hospital. Treatment may include: Taking supplements of iron, vitamin B12, or folic acid. Taking a hormone medicine (erythropoietin) that can help to stimulate red blood cell growth. Having a blood transfusion. This may be needed if you lose a lot of blood. Making changes to your diet. Having surgery to remove your spleen. Follow these instructions at home: Take over-the-counter and prescription medicines only as told by your health care provider. Take supplements only as told by your health care provider. Follow any diet instructions that you were given by your health care provider. Keep all follow-up visits as told by your health care provider. This is important. Contact a health care provider if: You develop new bleeding anywhere in the body. Get help right away if: You are very weak. You are short of breath. You have pain in your abdomen or chest. You are dizzy or feel faint. You have trouble concentrating. You have bloody stools, black stools, or tarry stools. You vomit repeatedly or you vomit up blood. These symptoms may represent a serious problem that is an emergency. Do not wait to see if the symptoms will go away. Get medical help right away. Call your local emergency services (911 in the [place] Do not drive yourself to the hospital. Summary Anemia is a condition in which you do not have enough red blood cells or enough of a substance in your red blood cells that carries oxygen (hemoglobin). Symptoms may occur suddenly   or develop slowly. If your anemia is mild, you may not have symptoms. This condition is diagnosed with blood tests, a medical history, and a physical exam. Other tests may be  needed. Treatment for this condition depends on the cause of the anemia. This information is not intended to replace advice given to you by your health care provider. Make sure you discuss any questions you have with your healthcare provider. Document Revised: 11/16/2019 Document Reviewed: 11/16/2019 Elsevier Patient Education  2022 [employer].

## 2021-08-17 LAB — CBC WITH DIFFERENTIAL/PLATELET
Absolute Monocytes: 562 cells/uL (ref 200–950)
Basophils Absolute: 37 cells/uL (ref 0–200)
Basophils Relative: 0.5 %
Eosinophils Absolute: 117 cells/uL (ref 15–500)
Eosinophils Relative: 1.6 %
HCT: 33.4 % — ABNORMAL LOW (ref 35.0–45.0)
Hemoglobin: 10.8 g/dL — ABNORMAL LOW (ref 11.7–15.5)
Lymphs Abs: 1380 cells/uL (ref 850–3900)
MCH: 27.8 pg (ref 27.0–33.0)
MCHC: 32.3 g/dL (ref 32.0–36.0)
MCV: 85.9 fL (ref 80.0–100.0)
MPV: 10.5 fL (ref 7.5–12.5)
Monocytes Relative: 7.7 %
Neutro Abs: 5205 cells/uL (ref 1500–7800)
Neutrophils Relative %: 71.3 %
Platelets: 369 10*3/uL (ref 140–400)
RBC: 3.89 10*6/uL (ref 3.80–5.10)
RDW: 14.5 % (ref 11.0–15.0)
Total Lymphocyte: 18.9 %
WBC: 7.3 10*3/uL (ref 3.8–10.8)

## 2021-08-17 LAB — IRON,TIBC AND FERRITIN PANEL
%SAT: 14 % (calc) — ABNORMAL LOW (ref 16–45)
Ferritin: 38 ng/mL (ref 16–288)
Iron: 48 ug/dL (ref 45–160)
TIBC: 338 mcg/dL (calc) (ref 250–450)

## 2021-10-02 DIAGNOSIS — Z23 Encounter for immunization: Secondary | ICD-10-CM | POA: Diagnosis not present

## 2021-10-18 ENCOUNTER — Other Ambulatory Visit: Payer: Self-pay

## 2021-10-18 ENCOUNTER — Ambulatory Visit (INDEPENDENT_AMBULATORY_CARE_PROVIDER_SITE_OTHER): Payer: Medicare Other | Admitting: Internal Medicine

## 2021-10-18 ENCOUNTER — Encounter: Payer: Self-pay | Admitting: Internal Medicine

## 2021-10-18 VITALS — BP 132/76 | HR 86 | Temp 98.2°F | Resp 17 | Ht 63.5 in | Wt 200.4 lb

## 2021-10-18 DIAGNOSIS — K5909 Other constipation: Secondary | ICD-10-CM | POA: Diagnosis not present

## 2021-10-18 DIAGNOSIS — R1030 Lower abdominal pain, unspecified: Secondary | ICD-10-CM

## 2021-10-18 DIAGNOSIS — E6609 Other obesity due to excess calories: Secondary | ICD-10-CM | POA: Diagnosis not present

## 2021-10-18 DIAGNOSIS — R195 Other fecal abnormalities: Secondary | ICD-10-CM | POA: Diagnosis not present

## 2021-10-18 DIAGNOSIS — Z6834 Body mass index (BMI) 34.0-34.9, adult: Secondary | ICD-10-CM

## 2021-10-18 DIAGNOSIS — D509 Iron deficiency anemia, unspecified: Secondary | ICD-10-CM | POA: Diagnosis not present

## 2021-10-18 DIAGNOSIS — R140 Abdominal distension (gaseous): Secondary | ICD-10-CM | POA: Diagnosis not present

## 2021-10-18 LAB — CBC
HCT: 33.9 % — ABNORMAL LOW (ref 35.0–45.0)
Hemoglobin: 11.2 g/dL — ABNORMAL LOW (ref 11.7–15.5)
MCH: 28.4 pg (ref 27.0–33.0)
MCHC: 33 g/dL (ref 32.0–36.0)
MCV: 86 fL (ref 80.0–100.0)
MPV: 11 fL (ref 7.5–12.5)
Platelets: 363 10*3/uL (ref 140–400)
RBC: 3.94 10*6/uL (ref 3.80–5.10)
RDW: 13.6 % (ref 11.0–15.0)
WBC: 6.9 10*3/uL (ref 3.8–10.8)

## 2021-10-18 NOTE — Assessment & Plan Note (Signed)
Encourage diet and exercise for weight loss 

## 2021-10-18 NOTE — Patient Instructions (Signed)
Diet for Irritable Bowel Syndrome When you have irritable bowel syndrome (IBS), it is very important to eat the foods and follow the eating habits that are best for your condition. IBS may cause various symptoms such as pain in the abdomen, constipation, or diarrhea. Choosing the right foods can help to ease the discomfort from these symptoms. Work with your health care provider and diet and nutrition specialist (dietitian) to find the eating plan that will help to control your symptoms. What are tips for following this plan?   Keep a food diary. This will help you identify foods that cause symptoms. Write down: What you eat and when you eat it. What symptoms you have. When symptoms occur in relation to your meals, such as "pain in abdomen 2 hours after dinner." Eat your meals slowly and in a relaxed setting. Aim to eat 5-6 small meals per day. Do not skip meals. Drink enough fluid to keep your urine pale yellow. Ask your health care provider if you should take an over-the-counter probiotic to help restore healthy bacteria in your gut (digestive tract). Probiotics are foods that contain good bacteria and yeasts. Your dietitian may have specific dietary recommendations for you based on your symptoms. He or she may recommend that you: Avoid foods that cause symptoms. Talk with your dietitian about other ways to get the same nutrients that are in those problem foods. Avoid foods with gluten. Gluten is a protein that is found in rye, wheat, and barley. Eat more foods that contain soluble fiber. Examples of foods with high soluble fiber include oats, seeds, and certain fruits and vegetables. Take a fiber supplement if directed by your dietitian. Reduce or avoid certain foods called FODMAPs. These are foods that contain carbohydrates that are hard to digest. Ask your doctor which foods contain these carbohydrates. What foods are not recommended? The following are some foods and drinks that may make your  symptoms worse: Fatty foods, such as french fries. Foods that contain gluten, such as pasta and cereal. Dairy products, such as milk, cheese, and ice cream. Chocolate. Alcohol. Products with caffeine, such as coffee. Carbonated drinks, such as soda. Foods that are high in FODMAPs. These include certain fruits and vegetables. Products with sweeteners such as honey, high fructose corn syrup, sorbitol, and mannitol. The items listed above may not be a complete list of foods and beverages you should avoid. Contact a dietitian for more information. What foods are good sources of fiber? Your health care provider or dietitian may recommend that you eat more foods that contain fiber. Fiber can help to reduce constipation and other IBS symptoms. Add foods with fiber to your diet a little at a time so your body can get used to them. Too much fiber at one time might cause gas and swelling of your abdomen. The following are some foods that are good sources of fiber: Berries, such as raspberries, strawberries, and blueberries. Tomatoes. Carrots. Brown rice. Oats. Seeds, such as chia and pumpkin seeds. The items listed above may not be a complete list of recommended sources of fiber. Contact your dietitian for more options. Where to find more information International Foundation for Functional Gastrointestinal Disorders: www.iffgd.[employer] and Digestive [employer] Kidney Diseases: [employer] Summary When you have irritable bowel syndrome (IBS), it is very important to eat the foods and follow the eating habits that are best for your condition. IBS may cause various symptoms such as pain in the abdomen, constipation, or diarrhea. Choosing the right  foods can help to ease the discomfort that comes from symptoms. Keep a food diary. This will help you identify foods that cause symptoms. Your health care provider or diet and nutrition specialist (dietitian) may recommend that you  eat more foods that contain fiber. This information is not intended to replace advice given to you by your health care provider. Make sure you discuss any questions you have with your health care provider. Document Revised: 08/10/2020 Document Reviewed: 08/10/2020 Elsevier Patient Education  2022 [employer].

## 2021-10-18 NOTE — Assessment & Plan Note (Signed)
CBC today We will follow-up with her after labs with further recommendation and treatment plan

## 2021-10-18 NOTE — Progress Notes (Signed)
Subjective:    Patient ID: Terri Crawford, female    DOB: March 25, 1951, 70 y.o.   MRN: [id number]  HPI  Patient presents the clinic today for follow-up of anemia.  Anemia: Her last H/H was 10.8/33.4, 07/2021.  She was started on a oral iron supplement at that time.  Her urine tested negative for blood.  Her Hemoccult was negative.  She had been donating blood frequently but has abstained from this for the last 2 months.  She also reports lower abdominal pain, gassiness, intermittent constipation and loose watery stools.  She has been drinking more water, and tried an elimination diet without any relief of symptoms.  She denies nausea, vomiting, reflux or blood in her stool.  She has not tried anything OTC for this.  She has never been diagnosed with irritable bowel syndrome.  Review of Systems     Past Medical History:  Diagnosis Date   Allergy    Basal cell carcinoma    Chicken pox    GERD (gastroesophageal reflux disease)    Heart murmur    History of blood transfusion    History of stomach ulcers    Hx of adenomatous polyp of colon 09/05/2017   Post-operative nausea and vomiting    "slow to wake up"    Current Outpatient Medications  Medication Sig Dispense Refill   atorvastatin (LIPITOR) 10 MG tablet TAKE 1 TABLET BY MOUTH EVERY DAY 90 tablet 3   cetirizine (ZYRTEC) 10 MG tablet Take 10 mg by mouth daily.     latanoprost (XALATAN) 0.005 % ophthalmic solution 1 drop at bedtime.     Multiple Vitamins-Minerals (VISION FORMULA EYE HEALTH) CAPS Take 2 capsules by mouth daily.     Multiple Vitamins-Minerals (WOMENS MULTIVITAMIN PO) Take 1 tablet by mouth daily.     omeprazole (PRILOSEC) 20 MG capsule TAKE 1 CAPSULE BY MOUTH EVERY DAY 90 capsule 0   Polyethyl Glycol-Propyl Glycol (SYSTANE OP) Apply 1 drop to eye 2 (two) times daily.      No current facility-administered medications for this visit.    Allergies  Allergen Reactions   Phenobarbital Other (See Comments)     syncope   Codeine Nausea And Vomiting    Family History  Problem Relation Age of Onset   Arthritis Mother    Uterine cancer Mother    Diabetes Mother    Heart disease Father    Cancer Maternal Uncle    Throat cancer Maternal Grandmother    Diabetes Maternal Grandmother    Prostate cancer Maternal Grandfather    Colon cancer Neg Hx     Social History   Socioeconomic History   Marital status: Divorced    Spouse name: Not on file   Number of children: Not on file   Years of education: Not on file   Highest education level: Not on file  Occupational History   Not on file  Tobacco Use   Smoking status: Former   Smokeless tobacco: Never  Vaping Use   Vaping Use: Never used  Substance and Sexual Activity   Alcohol use: No   Drug use: No   Sexual activity: Not Currently  Other Topics Concern   Not on file  Social History Narrative   Not on file   Social Determinants of Health   Financial Resource Strain: Not on file  Food Insecurity: Not on file  Transportation Needs: Not on file  Physical Activity: Not on file  Stress: Not on file  Social Connections:  Not on file  Intimate Partner Violence: Not on file     Constitutional: Denies fever, malaise, fatigue, headache or abrupt weight changes.  Respiratory: Denies difficulty breathing, shortness of breath, cough or sputum production.   Cardiovascular: Denies chest pain, chest tightness, palpitations or swelling in the hands or feet.  Gastrointestinal: Patient reports abdominal pain, gassiness, constipation and loose stools.  Denies bloating, or blood in the stool.  GU: Denies urgency, frequency, pain with urination, burning sensation, blood in urine, odor or discharge.   No other specific complaints in a complete review of systems (except as listed in HPI above).  Objective:   Physical Exam   BP 132/76 (BP Location: Right Arm, Patient Position: Sitting, Cuff Size: Normal)   Pulse 86   Temp 98.2 F (36.8 C)  (Oral)   Resp 17   Ht 5' 3.5" (1.613 m)   Wt 200 lb 6.4 oz (90.9 kg)   SpO2 100%   BMI 34.94 kg/m   Wt Readings from Last 3 Encounters:  08/16/21 203 lb 3.2 oz (92.2 kg)  05/03/21 206 lb 8 oz (93.7 kg)  07/06/20 220 lb 4 oz (99.9 kg)    General: Appears her stated age, well developed, well nourished in NAD. Skin: Warm, dry and intact. No bruising noted. Cardiovascular: Normal rate and rhythm. S1,S2 noted.  No murmur, rubs or gallops noted.  Pulmonary/Chest: Normal effort and positive vesicular breath sounds. No respiratory distress. No wheezes, rales or ronchi noted.  Abdomen:  Normal bowel sounds Neurological: Alert and oriented.   BMET    Component Value Date/Time   NA 134 (L) 05/03/2021 1334   K 4.3 05/03/2021 1334   CL 97 (L) 05/03/2021 1334   CO2 27 05/03/2021 1334   GLUCOSE 86 05/03/2021 1334   BUN 13 05/03/2021 1334   CREATININE 0.49 (L) 05/03/2021 1334   CALCIUM 10.1 05/03/2021 1334    Lipid Panel     Component Value Date/Time   CHOL 210 (H) 05/03/2021 1334   TRIG 105 05/03/2021 1334   HDL 76 05/03/2021 1334   CHOLHDL 2.8 05/03/2021 1334   VLDL 14.2 02/25/2020 0841   LDLCALC 113 (H) 05/03/2021 1334    CBC    Component Value Date/Time   WBC 7.3 08/16/2021 1354   RBC 3.89 08/16/2021 1354   HGB 10.8 (L) 08/16/2021 1354   HCT 33.4 (L) 08/16/2021 1354   PLT 369 08/16/2021 1354   MCV 85.9 08/16/2021 1354   MCH 27.8 08/16/2021 1354   MCHC 32.3 08/16/2021 1354   RDW 14.5 08/16/2021 1354   LYMPHSABS 1,380 08/16/2021 1354   EOSABS 117 08/16/2021 1354   BASOSABS 37 08/16/2021 1354    Hgb A1C No results found for: HGBA1C         Assessment & Plan:   Abdominal Pain, Gassiness, Constipation and Loose Stools:  Concern for IBS Advised her to try either activity a yogurt or MiraLAX daily for 2 weeks and update me on her symptoms.  Webb Silversmith, NP This visit occurred during the SARS-CoV-2 public health emergency.  Safety protocols were in place,  including screening questions prior to the visit, additional usage of staff PPE, and extensive cleaning of exam room while observing appropriate contact time as indicated for disinfecting solutions.

## 2021-11-18 ENCOUNTER — Other Ambulatory Visit: Payer: Self-pay | Admitting: Internal Medicine

## 2021-11-18 DIAGNOSIS — R109 Unspecified abdominal pain: Secondary | ICD-10-CM

## 2021-11-18 DIAGNOSIS — K2100 Gastro-esophageal reflux disease with esophagitis, without bleeding: Secondary | ICD-10-CM

## 2021-11-18 DIAGNOSIS — J029 Acute pharyngitis, unspecified: Secondary | ICD-10-CM

## 2021-11-19 MED ORDER — OMEPRAZOLE 20 MG PO CPDR: DELAYED_RELEASE_CAPSULE | ORAL | 0 refills | Status: DC

## 2022-02-01 ENCOUNTER — Encounter: Payer: Self-pay | Admitting: Internal Medicine

## 2022-02-08 DIAGNOSIS — H353223 Exudative age-related macular degeneration, left eye, with inactive scar: Secondary | ICD-10-CM | POA: Diagnosis not present

## 2022-02-08 DIAGNOSIS — H35371 Puckering of macula, right eye: Secondary | ICD-10-CM | POA: Diagnosis not present

## 2022-02-08 DIAGNOSIS — H15833 Staphyloma posticum, bilateral: Secondary | ICD-10-CM | POA: Diagnosis not present

## 2022-02-08 DIAGNOSIS — H442E3 Degenerative myopia with other maculopathy, bilateral eye: Secondary | ICD-10-CM | POA: Diagnosis not present

## 2022-02-08 DIAGNOSIS — H353113 Nonexudative age-related macular degeneration, right eye, advanced atrophic without subfoveal involvement: Secondary | ICD-10-CM | POA: Diagnosis not present

## 2022-02-11 ENCOUNTER — Other Ambulatory Visit: Payer: Self-pay | Admitting: Internal Medicine

## 2022-02-11 DIAGNOSIS — K2100 Gastro-esophageal reflux disease with esophagitis, without bleeding: Secondary | ICD-10-CM

## 2022-02-11 DIAGNOSIS — R109 Unspecified abdominal pain: Secondary | ICD-10-CM

## 2022-02-11 DIAGNOSIS — J029 Acute pharyngitis, unspecified: Secondary | ICD-10-CM

## 2022-02-11 MED ORDER — OMEPRAZOLE 20 MG PO CPDR: DELAYED_RELEASE_CAPSULE | ORAL | 0 refills | Status: DC

## 2022-04-23 ENCOUNTER — Telehealth: Payer: Self-pay

## 2022-04-23 NOTE — Telephone Encounter (Signed)
Left a message for patient to call back and schedule their Medicare Annual Wellness Visit (AWV) virtually, telephone or face to face. ?  ?Tifanie Gardiner, CMA ?[phone]  ?

## 2022-05-07 ENCOUNTER — Ambulatory Visit (INDEPENDENT_AMBULATORY_CARE_PROVIDER_SITE_OTHER): Payer: Medicare Other

## 2022-05-07 VITALS — Wt 190.5 lb

## 2022-05-07 DIAGNOSIS — Z0000 Encounter for general adult medical examination without abnormal findings: Secondary | ICD-10-CM

## 2022-05-07 DIAGNOSIS — H40003 Preglaucoma, unspecified, bilateral: Secondary | ICD-10-CM | POA: Diagnosis not present

## 2022-05-07 NOTE — Patient Instructions (Signed)
Fall Prevention in the Home, Adult ?Falls can cause injuries and can happen to people of all ages. There are many things you can do to make your home safe and to help prevent falls. Ask for help when making these changes. ?What actions can I take to prevent falls? ?General Instructions ?Use good lighting in all rooms. Replace any light bulbs that burn out. ?Turn on the lights in dark areas. Use night-lights. ?Keep items that you use often in easy-to-reach places. Lower the shelves around your home if needed. ?Set up your furniture so you have a clear path. Avoid moving your furniture around. ?Do not have throw rugs or other things on the floor that can make you trip. ?Avoid walking on wet floors. ?If any of your floors are uneven, fix them. ?Add color or contrast paint or tape to clearly mark and help you see: ?Grab bars or handrails. ?First and last steps of staircases. ?Where the edge of each step is. ?If you use a stepladder: ?Make sure that it is fully opened. Do not climb a closed stepladder. ?Make sure the sides of the stepladder are locked in place. ?Ask someone to hold the stepladder while you use it. ?Know where your pets are when moving through your home. ?What can I do in the bathroom? ? ?  ? ?Keep the floor dry. Clean up any water on the floor right away. ?Remove soap buildup in the tub or shower. ?Use nonskid mats or decals on the floor of the tub or shower. ?Attach bath mats securely with double-sided, nonslip rug tape. ?If you need to sit down in the shower, use a plastic, nonslip stool. ?Install grab bars by the toilet and in the tub and shower. Do not use towel bars as grab bars. ?What can I do in the bedroom? ?Make sure that you have a light by your bed that is easy to reach. ?Do not use any sheets or blankets for your bed that hang to the floor. ?Have a firm chair with side arms that you can use for support when you get dressed. ?What can I do in the kitchen? ?Clean up any spills right away. ?If  you need to reach something above you, use a step stool with a grab bar. ?Keep electrical cords out of the way. ?Do not use floor polish or wax that makes floors slippery. ?What can I do with my stairs? ?Do not leave any items on the stairs. ?Make sure that you have a light switch at the top and the bottom of the stairs. ?Make sure that there are handrails on both sides of the stairs. Fix handrails that are broken or loose. ?Install nonslip stair treads on all your stairs. ?Avoid having throw rugs at the top or bottom of the stairs. ?Choose a carpet that does not hide the edge of the steps on the stairs. ?Check carpeting to make sure that it is firmly attached to the stairs. Fix carpet that is loose or worn. ?What can I do on the outside of my home? ?Use bright outdoor lighting. ?Fix the edges of walkways and driveways and fix any cracks. ?Remove anything that might make you trip as you walk through a door, such as a raised step or threshold. ?Trim any bushes or trees on paths to your home. ?Check to see if handrails are loose or broken and that both sides of all steps have handrails. ?Install guardrails along the edges of any raised decks and porches. ?Clear paths of anything  that can make you trip, such as tools or rocks. ?Have leaves, snow, or ice cleared regularly. ?Use sand or salt on paths during winter. ?Clean up any spills in your garage right away. This includes grease or oil spills. ?What other actions can I take? ?Wear shoes that: ?Have a low heel. Do not wear high heels. ?Have rubber bottoms. ?Feel good on your feet and fit well. ?Are closed at the toe. Do not wear open-toe sandals. ?Use tools that help you move around if needed. These include: ?Canes. ?Walkers. ?Scooters. ?Crutches. ?Review your medicines with your doctor. Some medicines can make you feel dizzy. This can increase your chance of falling. ?Ask your doctor what else you can do to help prevent falls. ?Where to find more information ?Centers  for Disease Control and Prevention, [facility]: http://www.wolf.info/ ?[employer] on Aging: http://kim-miller.com/ ?Contact a doctor if: ?You are afraid of falling at home. ?You feel weak, drowsy, or dizzy at home. ?You fall at home. ?Summary ?There are many simple things that you can do to make your home safe and to help prevent falls. ?Ways to make your home safe include removing things that can make you trip and installing grab bars in the bathroom. ?Ask for help when making these changes in your home. ?This information is not intended to replace advice given to you by your health care provider. Make sure you discuss any questions you have with your health care provider. ?Document Revised: 09/10/2021 Document Reviewed: 07/12/2020 ?Elsevier Patient Education ? [place]. ? ?Glaucoma ? ?Glaucoma happens when the fluid pressure in the eye is too high. If the pressure stays high for too long, the eye may get damaged. This can cause a loss of vision. ?There are two types of glaucoma. They are: ?Open-angle glaucoma. This is the most common type. It causes pressure in the eye to go up slowly. There may be no symptoms at first. Testing for this condition can help to find the condition before damage happens. Early treatment can often stop vision loss. ?Acute angle-closure glaucoma. With this type, the pressure inside the eye rises suddenly to a very high level. This causes very bad pain and needs to be treated right away. ?What are the causes? ?In many cases, the cause is not known. Glaucoma can sometimes result from other diseases, such as infection, cataracts, or tumors. ?What increases the risk? ?Being older than age 62. ?Having high blood pressure or diabetes. ?Having a family history of glaucoma. ?Having had an eye injury or eye surgery in the past. ?Being farsighted. ?Taking certain medicines. ?What are the signs or symptoms? ?Open-angle glaucoma often causes no symptoms early on. If it is not treated, the  condition: ?Will get worse and may cause a loss of side vision (peripheral vision). ?Can advance to tunnel vision, which means that you are able to see straight ahead but you have a loss of side vision in all directions. ?May lead to a total loss of vision. ?Symptoms of acute angle-closure glaucoma develop suddenly and may include: ?Cloudy vision. ?Very bad pain in your affected eye. ?A very bad headache in the area around your eye. ?Feeling like you may vomit. ?Vomiting. ?How is this treated? ?Eye drops. ?Laser treatment. ?Surgery. ?Follow these instructions at home: ?Medicines ?Take over-the-counter and prescription medicines only as told by your doctor. ?If you were given eye drops, use them exactly as told. You will likely need to use this medicine for the rest of your life. ?General instructions ?Exercise often.  Talk with your doctor about which types of exercise are safe for you. Do not do exercises where you lean your head on the floor while you lift part of your body off the floor, such as a headstand. ?Keep all follow-up visits. ?Contact a doctor if: ?Your symptoms get worse. ?You have new symptoms. ?Get help right away if: ?You have very bad pain in your eye. ?You have vision problems. ?You have a bad headache in the area around your eye. ?You feel like you may vomit (nauseous). ?You vomit. ?You start to have the same problems with your other eye. ?Summary ?Glaucoma happens when the fluid pressure in the eye is too high. If this is not treated, it can cause a loss of vision. ?There may be no symptoms at first. Testing can help to find the condition before damage happens. ?Early treatment can often stop vision loss. ?This information is not intended to replace advice given to you by your health care provider. Make sure you discuss any questions you have with your health care provider. ?Document Revised: 05/11/2020 Document Reviewed: 05/11/2020 ?Elsevier Patient Education ? [place]. ? ?Health  Maintenance, Female ?Adopting a healthy lifestyle and getting preventive care are important in promoting health and wellness. Ask your health care provider about: ?The right schedule for you to have regular tests and ex

## 2022-05-07 NOTE — Progress Notes (Signed)
I connected with Terri Crawford today by telephone and verified that I am speaking with the correct person using two identifiers. ?Location patient: home ?Location provider: work ?Persons participating in the virtual visit: Drue Camera, Donnie Mesa, CNA, CMA ?  ?I discussed the limitations, risks, security and privacy concerns of performing an evaluation and management service by telephone and the availability of in person appointments. I also discussed with the patient that there may be a patient responsible charge related to this service. The patient expressed understanding and verbally consented to this telephonic visit.  ? ? ? ? ?Subjective:  ? Terri Crawford is a 71 y.o. female who presents for Medicare Annual (Subsequent) preventive examination. ? ?Review of Systems    ?Per HPI unless specifically indicated below  ?  ? ?   ?Objective:  ?  ?Today's Vitals  ? 05/07/22 1318  ?Weight: 190 lb 8 oz (86.4 kg)  ?PainSc: 0-No pain  ? ? ?Wt Readings from Last 3 Encounters:  ?10/18/21 200 lb 6.4 oz (90.9 kg)  ?08/16/21 203 lb 3.2 oz (92.2 kg)  ?05/03/21 206 lb 8 oz (93.7 kg)  ? ?Temp Readings from Last 3 Encounters:  ?10/18/21 98.2 ?F (36.8 ?C) (Oral)  ?08/16/21 (!) 97.5 ?F (36.4 ?C) (Temporal)  ?07/06/20 99.1 ?F (37.3 ?C) (Oral)  ? ?BP Readings from Last 3 Encounters:  ?10/18/21 132/76  ?08/16/21 (!) 131/50  ?05/03/21 (!) 149/57  ? ?Pulse Readings from Last 3 Encounters:  ?10/18/21 86  ?08/16/21 71  ?05/03/21 73  ? ?Body mass index is 33.22 kg/m?. ? ? ?  08/27/2017  ?  1:47 PM 08/13/2017  ? 10:33 AM  ?Advanced Directives  ?Does Patient Have a Medical Advance Directive? Yes Yes  ?Type of Paramedic of [place];Living will [place];Living will  ? ? ?Current Medications (verified) ?Outpatient Encounter Medications as of 05/07/2022  ?Medication Sig  ? atorvastatin (LIPITOR) 10 MG tablet TAKE 1 TABLET BY MOUTH EVERY DAY  ? cetirizine (ZYRTEC) 10 MG tablet Take 10 mg by mouth  daily.  ? latanoprost (XALATAN) 0.005 % ophthalmic solution 1 drop at bedtime.  ? Multiple Vitamins-Minerals (VISION FORMULA EYE HEALTH) CAPS Take 2 capsules by mouth daily.  ? Multiple Vitamins-Minerals (WOMENS MULTIVITAMIN PO) Take 1 tablet by mouth daily.  ? omeprazole (PRILOSEC) 20 MG capsule TAKE 1 CAPSULE BY MOUTH EVERY DAY  ? Polyethyl Glycol-Propyl Glycol (SYSTANE OP) Apply 1 drop to eye 2 (two) times daily.   ? timolol (TIMOPTIC) 0.5 % ophthalmic solution SMARTSIG:In Eye(s)  ? [DISCONTINUED] ferrous sulfate 325 (65 FE) MG EC tablet Take 325 mg by mouth daily with breakfast.  ? ?No facility-administered encounter medications on file as of 05/07/2022.  ? ? ?Allergies (verified) ?Phenobarbital and Codeine  ? ?History: ?Past Medical History:  ?Diagnosis Date  ? Allergy   ? Basal cell carcinoma   ? Chicken pox   ? GERD (gastroesophageal reflux disease)   ? Heart murmur   ? History of blood transfusion   ? History of stomach ulcers   ? Hx of adenomatous polyp of colon 09/05/2017  ? Post-operative nausea and vomiting   ? "slow to wake up"  ? ?Past Surgical History:  ?Procedure Laterality Date  ? CATARACT EXTRACTION, BILATERAL    ? CESAREAN SECTION    ? [place]  ? TUBAL LIGATION    ? ?Family History  ?Problem Relation Age of Onset  ? Arthritis Mother   ? Uterine cancer Mother   ?  Diabetes Mother   ? Heart disease Father   ? Cancer Maternal Uncle   ? Throat cancer Maternal Grandmother   ? Diabetes Maternal Grandmother   ? Prostate cancer Maternal Grandfather   ? Colon cancer Neg Hx   ? ?Social History  ? ?Socioeconomic History  ? Marital status: Divorced  ?  Spouse name: Not on file  ? Number of children: 1  ? Years of education: college  ? Highest education level: Not on file  ?Occupational History  ? Occupation: Engineer, maintenance  ? Occupation: Corporate investment banker  ?Tobacco Use  ? Smoking status: Former  ? Smokeless tobacco: Never  ?Vaping Use  ? Vaping Use: Never used  ?Substance and Sexual Activity   ? Alcohol use: No  ? Drug use: No  ? Sexual activity: Not Currently  ?Other Topics Concern  ? Not on file  ?Social History Narrative  ? Not on file  ? ?Social Determinants of Health  ? ?Financial Resource Strain: Low Risk   ? Difficulty of Paying Living Expenses: Not hard at all  ?Food Insecurity: No Food Insecurity  ? Worried About Charity fundraiser in the Last Year: Never true  ? Ran Out of Food in the Last Year: Never true  ?Transportation Needs: No Transportation Needs  ? Lack of Transportation (Medical): No  ? Lack of Transportation (Non-Medical): No  ?Physical Activity: Sufficiently Active  ? Days of Exercise per Week: 4 days  ? Minutes of Exercise per Session: 40 min  ?Stress: No Stress Concern Present  ? Feeling of Stress : Not at all  ?Social Connections: Moderately Integrated  ? Frequency of Communication with Friends and Family: More than three times a week  ? Frequency of Social Gatherings with Friends and Family: More than three times a week  ? Attends Religious Services: More than 4 times per year  ? Active Member of Clubs or Organizations: Yes  ? Attends Archivist Meetings: More than 4 times per year  ? Marital Status: Divorced  ? ? ?Tobacco Counseling ?Counseling given: Not Answered ? ? ?Clinical Intake: ? ?Pre-visit preparation completed: No ? ?Pain : 0-10 ?Pain Score: 0-No pain ? ?  ? ?Nutritional Status: BMI 25 -29 Overweight ?Nutritional Risks: None ?Diabetes: No ? ?How often do you need to have someone help you when you read instructions, pamphlets, or other written materials from your doctor or pharmacy?: 1 - Never ? ?Diabetic?No ? ?Interpreter Needed?: No ? ?Information entered by :: Donnie Mesa, CMA ? ? ?Activities of Daily Living ? ?  05/07/2022  ?  7:26 AM 10/18/2021  ?  9:18 AM  ?In your present state of health, do you have any difficulty performing the following activities:  ?Hearing? 0 0  ?Vision? 1 1  ?Difficulty concentrating or making decisions? 0 0  ?Walking or  climbing stairs? 0 0  ?Dressing or bathing? 0 0  ?Doing errands, shopping? 0 0  ?Preparing Food and eating ? N   ?Using the Toilet? N   ?In the past six months, have you accidently leaked urine? N   ?Do you have problems with loss of bowel control? N   ?Managing your Medications? N   ?Managing your Finances? N   ?Housekeeping or managing your Housekeeping? N   ? ? ?Patient Care Team: ?Jearld Fenton, NP as PCP - General (Internal Medicine) ?Retina Specialist: Dr. Edwyna Shell Retina ?[facility]: Dr. Jamse Mead ? ?Indicate any recent Medical Services you may have received  from other than [facility] providers in the past year (date may be approximate). ?No hospitalization in the past 12 months.  ?   ?Assessment:  ? This is a routine wellness examination for [facility]. ? ?Hearing/Vision screen ?No results found. ? ?Dietary issues and exercise activities discussed: ?Current Exercise Habits: Structured exercise class, Time (Minutes): 60, Frequency (Times/Week): 4, Weekly Exercise (Minutes/Week): 240, Intensity: Moderate, Exercise limited by: None identified\ ? ?Diet:  She does eat meat. She consumes more veggies than fruits. She tries to avoid fried foods. She drinks mostly water, tea, coffee and Dt. Soda. ?Physical activity: [employer] 2-3 times per week ? ? Goals Addressed   ? ?  ?  ?  ?  ? This Visit's Progress  ?  Activity and Exercise Increased     ?  Evidence-based guidance:  ?Review current exercise levels.  ?Assess patient perspective on exercise or activity level, barriers to increasing activity, motivation and readiness for change.  ?Recommend or set healthy exercise goal based on individual tolerance.  ?Encourage small steps toward making change in amount of exercise or activity.  ?Urge reduction of sedentary activities or screen time.  ?Promote group activities within the community or with family or support person.  ?Consider referral to rehabiliation therapist for assessment and exercise/activity  plan.   ?Notes:  ?  ? ?  ? ?Depression Screen ? ?  05/07/2022  ?  1:21 PM 10/18/2021  ?  9:17 AM 05/03/2021  ?  2:04 PM 11/11/2019  ?  2:40 PM 11/11/2019  ?  2:38 PM 07/29/2019  ? 10:05 AM 07/17/2017  ?  3:29 PM

## 2022-05-13 ENCOUNTER — Encounter: Payer: Medicare Other | Admitting: Internal Medicine

## 2022-05-14 ENCOUNTER — Ambulatory Visit (INDEPENDENT_AMBULATORY_CARE_PROVIDER_SITE_OTHER): Payer: Medicare Other | Admitting: Internal Medicine

## 2022-05-14 ENCOUNTER — Encounter: Payer: Self-pay | Admitting: Internal Medicine

## 2022-05-14 VITALS — BP 122/70 | HR 87 | Temp 96.9°F | Wt 190.0 lb

## 2022-05-14 DIAGNOSIS — D508 Other iron deficiency anemias: Secondary | ICD-10-CM

## 2022-05-14 DIAGNOSIS — R030 Elevated blood-pressure reading, without diagnosis of hypertension: Secondary | ICD-10-CM

## 2022-05-14 DIAGNOSIS — Z6833 Body mass index (BMI) 33.0-33.9, adult: Secondary | ICD-10-CM | POA: Diagnosis not present

## 2022-05-14 DIAGNOSIS — R7309 Other abnormal glucose: Secondary | ICD-10-CM | POA: Diagnosis not present

## 2022-05-14 DIAGNOSIS — E7800 Pure hypercholesterolemia, unspecified: Secondary | ICD-10-CM | POA: Diagnosis not present

## 2022-05-14 DIAGNOSIS — R739 Hyperglycemia, unspecified: Secondary | ICD-10-CM

## 2022-05-14 DIAGNOSIS — E6609 Other obesity due to excess calories: Secondary | ICD-10-CM

## 2022-05-14 DIAGNOSIS — F320 Major depressive disorder, single episode, mild: Secondary | ICD-10-CM | POA: Diagnosis not present

## 2022-05-14 DIAGNOSIS — K2100 Gastro-esophageal reflux disease with esophagitis, without bleeding: Secondary | ICD-10-CM

## 2022-05-14 NOTE — Progress Notes (Signed)
Subjective:    Patient ID: Terri Crawford, female    DOB: 1951/04/21, 71 y.o.   MRN: [id number]  HPI  Patient presents to clinic today for follow-up of chronic conditions.  GERD: She is not sure what triggers. She denies breakthrough on Omeprazole.  There is no upper GI on file.  HLD: Her last LDL was 113, triglycerides 105, 04/2021.  She denies myalgias on Atorvastatin.  She tries to consume a low-fat diet.  Depression: Resolved. She is no longer taking Sertraline.  She is not seeing a therapist.  She denies anxiety, SI/HI.  HTN: Her BP today is 122/70.  She is not taking any antihypertensive medication at this time.  ECG from 06/2017 reviewed.  Iron Deficiency Anemia: Her last H/H was 11.2/33.9, 04/2021.  She is not currently taking any oral iron.  She does not follow with hematology.  Review of Systems  Past Medical History:  Diagnosis Date   Allergy    Basal cell carcinoma    Chicken pox    GERD (gastroesophageal reflux disease)    Heart murmur    History of blood transfusion    History of stomach ulcers    Hx of adenomatous polyp of colon 09/05/2017   Post-operative nausea and vomiting    "slow to wake up"    Current Outpatient Medications  Medication Sig Dispense Refill   atorvastatin (LIPITOR) 10 MG tablet TAKE 1 TABLET BY MOUTH EVERY DAY 90 tablet 3   cetirizine (ZYRTEC) 10 MG tablet Take 10 mg by mouth daily.     latanoprost (XALATAN) 0.005 % ophthalmic solution 1 drop at bedtime.     Multiple Vitamins-Minerals (VISION FORMULA EYE HEALTH) CAPS Take 2 capsules by mouth daily.     Multiple Vitamins-Minerals (WOMENS MULTIVITAMIN PO) Take 1 tablet by mouth daily.     omeprazole (PRILOSEC) 20 MG capsule TAKE 1 CAPSULE BY MOUTH EVERY DAY 90 capsule 0   Polyethyl Glycol-Propyl Glycol (SYSTANE OP) Apply 1 drop to eye 2 (two) times daily.      timolol (TIMOPTIC) 0.5 % ophthalmic solution SMARTSIG:In Eye(s)     No current facility-administered medications for this visit.     Allergies  Allergen Reactions   Phenobarbital Other (See Comments)    syncope   Codeine Nausea And Vomiting    Family History  Problem Relation Age of Onset   Arthritis Mother    Uterine cancer Mother    Diabetes Mother    Heart disease Father    Cancer Maternal Uncle    Throat cancer Maternal Grandmother    Diabetes Maternal Grandmother    Prostate cancer Maternal Grandfather    Colon cancer Neg Hx     Social History   Socioeconomic History   Marital status: Divorced    Spouse name: Not on file   Number of children: 1   Years of education: college   Highest education level: Not on file  Occupational History   Occupation: Engineer, maintenance   Occupation: Corporate investment banker  Tobacco Use   Smoking status: Former   Smokeless tobacco: Never  Scientific laboratory technician Use: Never used  Substance and Sexual Activity   Alcohol use: No   Drug use: No   Sexual activity: Not Currently  Other Topics Concern   Not on file  Social History Narrative   Not on file   Social Determinants of Health   Financial Resource Strain: Low Risk    Difficulty of Paying Living Expenses: Not hard at  all  Food Insecurity: No Food Insecurity   Worried About Charity fundraiser in the Last Year: Never true   Ran Out of Food in the Last Year: Never true  Transportation Needs: No Transportation Needs   Lack of Transportation (Medical): No   Lack of Transportation (Non-Medical): No  Physical Activity: Sufficiently Active   Days of Exercise per Week: 4 days   Minutes of Exercise per Session: 40 min  Stress: No Stress Concern Present   Feeling of Stress : Not at all  Social Connections: Moderately Integrated   Frequency of Communication with Friends and Family: More than three times a week   Frequency of Social Gatherings with Friends and Family: More than three times a week   Attends Religious Services: More than 4 times per year   Active Member of [employer] or Organizations: Yes   Attends Programme researcher, broadcasting/film/video: More than 4 times per year   Marital Status: Divorced  Human resources officer Violence: Not At Risk   Fear of Current or Ex-Partner: No   Emotionally Abused: No   Physically Abused: No   Sexually Abused: No     Constitutional: Denies fever, malaise, fatigue, headache or abrupt weight changes.  HEENT: Denies eye pain, eye redness, ear pain, ringing in the ears, wax buildup, runny nose, nasal congestion, bloody nose, or sore throat. Respiratory: Denies difficulty breathing, shortness of breath, cough or sputum production.   Cardiovascular: Denies chest pain, chest tightness, palpitations or swelling in the hands or feet.  Gastrointestinal: Denies abdominal pain, bloating, constipation, diarrhea or blood in the stool.  GU: Denies urgency, frequency, pain with urination, burning sensation, blood in urine, odor or discharge. Musculoskeletal: Denies decrease in range of motion, difficulty with gait, muscle pain or joint pain and swelling.  Skin: Denies redness, rashes, lesions or ulcercations.  Neurological: Denies dizziness, difficulty with memory, difficulty with speech or problems with balance and coordination.  Psych: Patient has a history of depression.  Denies anxiety, SI/HI.  No other specific complaints in a complete review of systems (except as listed in HPI above).     Objective:   Physical Exam BP 122/70 (BP Location: Left Arm, Patient Position: Sitting, Cuff Size: Large)   Pulse 87   Temp (!) 96.9 F (36.1 C) (Temporal)   Wt 190 lb (86.2 kg)   SpO2 99%   BMI 33.13 kg/m   Wt Readings from Last 3 Encounters:  05/07/22 190 lb 8 oz (86.4 kg)  10/18/21 200 lb 6.4 oz (90.9 kg)  08/16/21 203 lb 3.2 oz (92.2 kg)    General: Appears her stated age, obese, in NAD. Skin: Warm, dry and intact.  HEENT: Head: normal shape and size; Eyes: sclera white, no icterus, conjunctiva pink, PERRLA and EOMs intact;  Neck:  Neck supple, trachea midline. No masses, lumps or  thyromegaly present.  Cardiovascular: Normal rate and rhythm. S1,S2 noted.  No murmur, rubs or gallops noted. No JVD or BLE edema. No carotid bruits noted. Pulmonary/Chest: Normal effort and positive vesicular breath sounds. No respiratory distress. No wheezes, rales or ronchi noted.  Abdomen: Soft and nontender. Normal bowel sounds.  Musculoskeletal: No difficulty with gait.  Neurological: Alert and oriented.  Psychiatric: Mood and affect normal. Behavior is normal. Judgment and thought content normal.    BMET    Component Value Date/Time   NA 134 (L) 05/03/2021 1334   K 4.3 05/03/2021 1334   CL 97 (L) 05/03/2021 1334   CO2 27 05/03/2021 1334  GLUCOSE 86 05/03/2021 1334   BUN 13 05/03/2021 1334   CREATININE 0.49 (L) 05/03/2021 1334   CALCIUM 10.1 05/03/2021 1334    Lipid Panel     Component Value Date/Time   CHOL 210 (H) 05/03/2021 1334   TRIG 105 05/03/2021 1334   HDL 76 05/03/2021 1334   CHOLHDL 2.8 05/03/2021 1334   VLDL 14.2 02/25/2020 0841   LDLCALC 113 (H) 05/03/2021 1334    CBC    Component Value Date/Time   WBC 6.9 10/18/2021 0930   RBC 3.94 10/18/2021 0930   HGB 11.2 (L) 10/18/2021 0930   HCT 33.9 (L) 10/18/2021 0930   PLT 363 10/18/2021 0930   MCV 86.0 10/18/2021 0930   MCH 28.4 10/18/2021 0930   MCHC 33.0 10/18/2021 0930   RDW 13.6 10/18/2021 0930   LYMPHSABS 1,380 08/16/2021 1354   EOSABS 117 08/16/2021 1354   BASOSABS 37 08/16/2021 1354    Hgb A1C No results found for: HGBA1C         Assessment & Plan:    Webb Silversmith, NP

## 2022-05-14 NOTE — Assessment & Plan Note (Signed)
CBC today.  

## 2022-05-14 NOTE — Assessment & Plan Note (Signed)
Encourage diet and exercise for weight loss 

## 2022-05-14 NOTE — Assessment & Plan Note (Signed)
In remission We will continue to monitor

## 2022-05-14 NOTE — Patient Instructions (Signed)
Heart-Healthy Eating Plan Heart-healthy meal planning includes: Eating less unhealthy fats. Eating more healthy fats. Making other changes in your diet. Talk with your doctor or a diet specialist (dietitian) to create an eating plan that is right for you. What is my plan? Your doctor may recommend an eating plan that includes: Total fat: ______% or less of total calories a day. Saturated fat: ______% or less of total calories a day. Cholesterol: less than _________mg a day. What are tips for following this plan? Cooking Avoid frying your food. Try to bake, boil, grill, or broil it instead. You can also reduce fat by: Removing the skin from poultry. Removing all visible fats from meats. Steaming vegetables in water or broth. Meal planning  At meals, divide your plate into four equal parts: Fill one-half of your plate with vegetables and green salads. Fill one-fourth of your plate with whole grains. Fill one-fourth of your plate with lean protein foods. Eat 4-5 servings of vegetables per day. A serving of vegetables is: 1 cup of raw or cooked vegetables. 2 cups of raw leafy greens. Eat 4-5 servings of fruit per day. A serving of fruit is: 1 medium whole fruit.  cup of dried fruit.  cup of fresh, frozen, or canned fruit.  cup of 100% fruit juice. Eat more foods that have soluble fiber. These are apples, broccoli, carrots, beans, peas, and barley. Try to get 20-30 g of fiber per day. Eat 4-5 servings of nuts, legumes, and seeds per week: 1 serving of dried beans or legumes equals  cup after being cooked. 1 serving of nuts is  cup. 1 serving of seeds equals 1 tablespoon. General information Eat more home-cooked food. Eat less restaurant, buffet, and fast food. Limit or avoid alcohol. Limit foods that are high in starch and sugar. Avoid fried foods. Lose weight if you are overweight. Keep track of how much salt (sodium) you eat. This is important if you have high blood  pressure. Ask your doctor to tell you more about this. Try to add vegetarian meals each week. Fats Choose healthy fats. These include olive oil and canola oil, flaxseeds, walnuts, almonds, and seeds. Eat more omega-3 fats. These include salmon, mackerel, sardines, tuna, flaxseed oil, and ground flaxseeds. Try to eat fish at least 2 times each week. Check food labels. Avoid foods with trans fats or high amounts of saturated fat. Limit saturated fats. These are often found in animal products, such as meats, butter, and cream. These are also found in plant foods, such as palm oil, palm kernel oil, and coconut oil. Avoid foods with partially hydrogenated oils in them. These have trans fats. Examples are stick margarine, some tub margarines, cookies, crackers, and other baked goods. What foods can I eat? Fruits All fresh, canned (in natural juice), or frozen fruits. Vegetables Fresh or frozen vegetables (raw, steamed, roasted, or grilled). Green salads. Grains Most grains. Choose whole wheat and whole grains most of the time. Rice and pasta, including brown rice and pastas made with whole wheat. Meats and other proteins Lean, well-trimmed beef, veal, pork, and lamb. Chicken and turkey without skin. All fish and shellfish. Wild duck, rabbit, pheasant, and venison. Egg whites or low-cholesterol egg substitutes. Dried beans, peas, lentils, and tofu. Seeds and most nuts. Dairy Low-fat or nonfat cheeses, including ricotta and mozzarella. Skim or 1% milk that is liquid, powdered, or evaporated. Buttermilk that is made with low-fat milk. Nonfat or low-fat yogurt. Fats and oils Non-hydrogenated (trans-free) margarines. Vegetable oils, including   soybean, sesame, sunflower, olive, peanut, safflower, corn, canola, and cottonseed. Salad dressings or mayonnaise made with a vegetable oil. Beverages Mineral water. Coffee and tea. Diet carbonated beverages. Sweets and desserts Sherbet, gelatin, and fruit ice.  Small amounts of dark chocolate. Limit all sweets and desserts. Seasonings and condiments All seasonings and condiments. The items listed above may not be a complete list of foods and drinks you can eat. Contact a dietitian for more options. What foods should I avoid? Fruits Canned fruit in heavy syrup. Fruit in cream or butter sauce. Fried fruit. Limit coconut. Vegetables Vegetables cooked in cheese, cream, or butter sauce. Fried vegetables. Grains Breads that are made with saturated or trans fats, oils, or whole milk. Croissants. Sweet rolls. Donuts. High-fat crackers, such as cheese crackers. Meats and other proteins Fatty meats, such as hot dogs, ribs, sausage, bacon, rib-eye roast or steak. High-fat deli meats, such as salami and bologna. Caviar. Domestic duck and goose. Organ meats, such as liver. Dairy Cream, sour cream, cream cheese, and creamed cottage cheese. Whole-milk cheeses. Whole or 2% milk that is liquid, evaporated, or condensed. Whole buttermilk. Cream sauce or high-fat cheese sauce. Yogurt that is made from whole milk. Fats and oils Meat fat, or shortening. Cocoa butter, hydrogenated oils, palm oil, coconut oil, palm kernel oil. Solid fats and shortenings, including bacon fat, salt pork, lard, and butter. Nondairy cream substitutes. Salad dressings with cheese or sour cream. Beverages Regular sodas and juice drinks with added sugar. Sweets and desserts Frosting. Pudding. Cookies. Cakes. Pies. Milk chocolate or white chocolate. Buttered syrups. Full-fat ice cream or ice cream drinks. The items listed above may not be a complete list of foods and drinks to avoid. Contact a dietitian for more information. Summary Heart-healthy meal planning includes eating less unhealthy fats, eating more healthy fats, and making other changes in your diet. Eat a balanced diet. This includes fruits and vegetables, low-fat or nonfat dairy, lean protein, nuts and legumes, whole grains, and  heart-healthy oils and fats. This information is not intended to replace advice given to you by your health care provider. Make sure you discuss any questions you have with your health care provider. Document Revised: 04/19/2021 Document Reviewed: 04/19/2021 Elsevier Patient Education  2022 Elsevier Inc.  

## 2022-05-14 NOTE — Assessment & Plan Note (Signed)
She will try to stop Omeprazole to see if she needs this medication Encouraged her to avoid foods that trigger reflux Encourage weight loss as this can help reduce reflux symptoms

## 2022-05-14 NOTE — Assessment & Plan Note (Signed)
C-Met and lipid profile today Encouraged her to consume low-fat diet Continue Atorvastatin

## 2022-05-14 NOTE — Assessment & Plan Note (Signed)
Blood pressure controlled without medication Reinforced DASH diet We will monitor

## 2022-05-15 LAB — CBC
HCT: 36.5 % (ref 35.0–45.0)
Hemoglobin: 11.9 g/dL (ref 11.7–15.5)
MCH: 28.7 pg (ref 27.0–33.0)
MCHC: 32.6 g/dL (ref 32.0–36.0)
MCV: 88.2 fL (ref 80.0–100.0)
MPV: 10.7 fL (ref 7.5–12.5)
Platelets: 342 10*3/uL (ref 140–400)
RBC: 4.14 10*6/uL (ref 3.80–5.10)
RDW: 14.5 % (ref 11.0–15.0)
WBC: 7.1 10*3/uL (ref 3.8–10.8)

## 2022-05-15 LAB — HEMOGLOBIN A1C
Hgb A1c MFr Bld: 5.7 % of total Hgb — ABNORMAL HIGH (ref <5.7)
Mean Plasma Glucose: 117 mg/dL
eAG (mmol/L): 6.5 mmol/L

## 2022-05-15 LAB — COMPLETE METABOLIC PANEL WITH GFR
AG Ratio: 1.5 (calc) (ref 1.0–2.5)
ALT: 10 U/L (ref 6–29)
AST: 14 U/L (ref 10–35)
Albumin: 4.3 g/dL (ref 3.6–5.1)
Alkaline phosphatase (APISO): 76 U/L (ref 37–153)
BUN/Creatinine Ratio: 33 (calc) — ABNORMAL HIGH (ref 6–22)
BUN: 18 mg/dL (ref 7–25)
CO2: 27 mmol/L (ref 20–32)
Calcium: 9.5 mg/dL (ref 8.6–10.4)
Chloride: 100 mmol/L (ref 98–110)
Creat: 0.55 mg/dL — ABNORMAL LOW (ref 0.60–1.00)
Globulin: 2.8 g/dL (calc) (ref 1.9–3.7)
Glucose, Bld: 101 mg/dL (ref 65–139)
Potassium: 4.3 mmol/L (ref 3.5–5.3)
Sodium: 136 mmol/L (ref 135–146)
Total Bilirubin: 0.3 mg/dL (ref 0.2–1.2)
Total Protein: 7.1 g/dL (ref 6.1–8.1)
eGFR: 99 mL/min/{1.73_m2} (ref >=60)

## 2022-05-15 LAB — LIPID PANEL
Cholesterol: 197 mg/dL (ref <200)
HDL: 68 mg/dL (ref >=50)
LDL Cholesterol (Calc): 104 mg/dL (calc) — ABNORMAL HIGH
Non-HDL Cholesterol (Calc): 129 mg/dL (calc) (ref <130)
Total CHOL/HDL Ratio: 2.9 (calc) (ref <5.0)
Triglycerides: 155 mg/dL — ABNORMAL HIGH (ref <150)

## 2022-06-05 ENCOUNTER — Other Ambulatory Visit: Payer: Self-pay | Admitting: Internal Medicine

## 2022-06-05 DIAGNOSIS — E7800 Pure hypercholesterolemia, unspecified: Secondary | ICD-10-CM

## 2022-06-05 NOTE — Telephone Encounter (Signed)
Requested Prescriptions  Pending Prescriptions Disp Refills  . atorvastatin (LIPITOR) 10 MG tablet [Pharmacy Med Name: ATORVASTATIN 10 MG TABLET] 90 tablet 1    Sig: TAKE 1 TABLET BY MOUTH EVERY DAY     Cardiovascular:  Antilipid - Statins Failed - 06/05/2022  9:53 AM      Failed - Lipid Panel in normal range within the last 12 months    Cholesterol  Date Value Ref Range Status  05/14/2022 197 <200 mg/dL Final   LDL Cholesterol (Calc)  Date Value Ref Range Status  05/14/2022 104 (H) mg/dL (calc) Final    Comment:    Reference range: <100 . Desirable range <100 mg/dL for primary prevention;   <70 mg/dL for patients with CHD or diabetic patients  with > or = 2 CHD risk factors. Marland Kitchen LDL-C is now calculated using the Martin-Hopkins  calculation, which is a validated novel method providing  better accuracy than the Friedewald equation in the  estimation of LDL-C.  Cresenciano Genre et al. Annamaria Helling. 6759;163(84): 2061-2068  (http://education.[facility].com/faq/FAQ164)    HDL  Date Value Ref Range Status  05/14/2022 68 > OR = 50 mg/dL Final   Triglycerides  Date Value Ref Range Status  05/14/2022 155 (H) <150 mg/dL Final         Passed - Patient is not pregnant      Passed - Valid encounter within last 12 months    Recent Outpatient Visits          3 weeks ago Pure hypercholesterolemia   [place], [place], NP   7 months ago Iron deficiency anemia, unspecified iron deficiency anemia type   [place], NP   9 months ago Anemia, unspecified type   [facility], Coralie Keens, NP   1 year ago Medicare annual wellness visit, subsequent   [facility] [employer], Coralie Keens, NP   1 year ago Viral sinusitis   [facility] [place], Coralie Keens, NP      Future Appointments            In 5 months Ba[place], Coralie Keens, NP [facility]

## 2022-06-19 DIAGNOSIS — H40003 Preglaucoma, unspecified, bilateral: Secondary | ICD-10-CM | POA: Diagnosis not present

## 2022-08-21 DIAGNOSIS — H53413 Scotoma involving central area, bilateral: Secondary | ICD-10-CM | POA: Diagnosis not present

## 2022-09-04 DIAGNOSIS — Z23 Encounter for immunization: Secondary | ICD-10-CM | POA: Diagnosis not present

## 2022-10-01 DIAGNOSIS — H5203 Hypermetropia, bilateral: Secondary | ICD-10-CM | POA: Diagnosis not present

## 2022-10-01 DIAGNOSIS — H52223 Regular astigmatism, bilateral: Secondary | ICD-10-CM | POA: Diagnosis not present

## 2022-10-01 DIAGNOSIS — Z135 Encounter for screening for eye and ear disorders: Secondary | ICD-10-CM | POA: Diagnosis not present

## 2022-10-01 DIAGNOSIS — H353131 Nonexudative age-related macular degeneration, bilateral, early dry stage: Secondary | ICD-10-CM | POA: Diagnosis not present

## 2022-10-01 DIAGNOSIS — Z961 Presence of intraocular lens: Secondary | ICD-10-CM | POA: Diagnosis not present

## 2022-10-01 DIAGNOSIS — H524 Presbyopia: Secondary | ICD-10-CM | POA: Diagnosis not present

## 2022-10-08 DIAGNOSIS — H353134 Nonexudative age-related macular degeneration, bilateral, advanced atrophic with subfoveal involvement: Secondary | ICD-10-CM | POA: Diagnosis not present

## 2022-10-08 DIAGNOSIS — H40003 Preglaucoma, unspecified, bilateral: Secondary | ICD-10-CM | POA: Diagnosis not present

## 2022-10-09 DIAGNOSIS — H442E3 Degenerative myopia with other maculopathy, bilateral eye: Secondary | ICD-10-CM | POA: Diagnosis not present

## 2022-10-09 DIAGNOSIS — H353223 Exudative age-related macular degeneration, left eye, with inactive scar: Secondary | ICD-10-CM | POA: Diagnosis not present

## 2022-10-09 DIAGNOSIS — H353211 Exudative age-related macular degeneration, right eye, with active choroidal neovascularization: Secondary | ICD-10-CM | POA: Diagnosis not present

## 2022-10-09 DIAGNOSIS — H15833 Staphyloma posticum, bilateral: Secondary | ICD-10-CM | POA: Diagnosis not present

## 2022-10-18 ENCOUNTER — Encounter: Payer: Self-pay | Admitting: Internal Medicine

## 2022-11-06 DIAGNOSIS — H15833 Staphyloma posticum, bilateral: Secondary | ICD-10-CM | POA: Diagnosis not present

## 2022-11-06 DIAGNOSIS — H353223 Exudative age-related macular degeneration, left eye, with inactive scar: Secondary | ICD-10-CM | POA: Diagnosis not present

## 2022-11-06 DIAGNOSIS — H353211 Exudative age-related macular degeneration, right eye, with active choroidal neovascularization: Secondary | ICD-10-CM | POA: Diagnosis not present

## 2022-11-19 ENCOUNTER — Ambulatory Visit: Payer: Medicare Other | Admitting: Internal Medicine

## 2022-12-20 DIAGNOSIS — H353211 Exudative age-related macular degeneration, right eye, with active choroidal neovascularization: Secondary | ICD-10-CM | POA: Diagnosis not present

## 2022-12-20 DIAGNOSIS — H35373 Puckering of macula, bilateral: Secondary | ICD-10-CM | POA: Diagnosis not present

## 2022-12-20 DIAGNOSIS — H43813 Vitreous degeneration, bilateral: Secondary | ICD-10-CM | POA: Diagnosis not present

## 2022-12-20 DIAGNOSIS — H442E3 Degenerative myopia with other maculopathy, bilateral eye: Secondary | ICD-10-CM | POA: Diagnosis not present

## 2022-12-20 DIAGNOSIS — H353223 Exudative age-related macular degeneration, left eye, with inactive scar: Secondary | ICD-10-CM | POA: Diagnosis not present

## 2023-01-03 ENCOUNTER — Other Ambulatory Visit: Payer: Self-pay | Admitting: Internal Medicine

## 2023-01-03 DIAGNOSIS — E7800 Pure hypercholesterolemia, unspecified: Secondary | ICD-10-CM

## 2023-01-03 NOTE — Telephone Encounter (Signed)
Requested Prescriptions  Pending Prescriptions Disp Refills   atorvastatin (LIPITOR) 10 MG tablet [Pharmacy Med Name: ATORVASTATIN 10 MG TABLET] 90 tablet 1    Sig: TAKE 1 TABLET BY MOUTH EVERY DAY     Cardiovascular:  Antilipid - Statins Failed - 01/03/2023  1:49 AM      Failed - Lipid Panel in normal range within the last 12 months    Cholesterol  Date Value Ref Range Status  05/14/2022 197 <200 mg/dL Final   LDL Cholesterol (Calc)  Date Value Ref Range Status  05/14/2022 104 (H) mg/dL (calc) Final    Comment:    Reference range: <100 . Desirable range <100 mg/dL for primary prevention;   <70 mg/dL for patients with CHD or diabetic patients  with > or = 2 CHD risk factors. Marland Kitchen LDL-C is now calculated using the Martin-Hopkins  calculation, which is a validated novel method providing  better accuracy than the Friedewald equation in the  estimation of LDL-C.  Cresenciano Genre et al. Annamaria Helling. 2409;735(32): 2061-2068  (http://education.[facility].com/faq/FAQ164)    HDL  Date Value Ref Range Status  05/14/2022 68 > OR = 50 mg/dL Final   Triglycerides  Date Value Ref Range Status  05/14/2022 155 (H) <150 mg/dL Final         Passed - Patient is not pregnant      Passed - Valid encounter within last 12 months    Recent Outpatient Visits           7 months ago Pure hypercholesterolemia   [facility] [place], [place], NP   1 year ago Iron deficiency anemia, unspecified iron deficiency anemia type   [facility] [place], Coralie Keens, NP   1 year ago Anemia, unspecified type   [facility] [place], Coralie Keens, NP   1 year ago Medicare annual wellness visit, subsequent   [facility] [place], Coralie Keens, NP   1 year ago Viral sinusitis   [facility] [place], Coralie Keens, NP

## 2023-01-04 ENCOUNTER — Encounter: Payer: Self-pay | Admitting: Internal Medicine

## 2023-02-11 DIAGNOSIS — H40003 Preglaucoma, unspecified, bilateral: Secondary | ICD-10-CM | POA: Diagnosis not present

## 2023-02-11 DIAGNOSIS — H353134 Nonexudative age-related macular degeneration, bilateral, advanced atrophic with subfoveal involvement: Secondary | ICD-10-CM | POA: Diagnosis not present

## 2023-02-11 DIAGNOSIS — Z961 Presence of intraocular lens: Secondary | ICD-10-CM | POA: Diagnosis not present

## 2023-02-18 DIAGNOSIS — H35373 Puckering of macula, bilateral: Secondary | ICD-10-CM | POA: Diagnosis not present

## 2023-02-18 DIAGNOSIS — H353211 Exudative age-related macular degeneration, right eye, with active choroidal neovascularization: Secondary | ICD-10-CM | POA: Diagnosis not present

## 2023-02-18 DIAGNOSIS — H353223 Exudative age-related macular degeneration, left eye, with inactive scar: Secondary | ICD-10-CM | POA: Diagnosis not present

## 2023-02-18 DIAGNOSIS — H442E3 Degenerative myopia with other maculopathy, bilateral eye: Secondary | ICD-10-CM | POA: Diagnosis not present

## 2023-02-18 DIAGNOSIS — H43813 Vitreous degeneration, bilateral: Secondary | ICD-10-CM | POA: Diagnosis not present

## 2023-04-23 ENCOUNTER — Telehealth: Payer: Self-pay | Admitting: Internal Medicine

## 2023-04-23 NOTE — Telephone Encounter (Signed)
Called patient to schedule Medicare Annual Wellness Visit (AWV). Left message for patient to call back and schedule Medicare Annual Wellness Visit (AWV).  Last date of AWV: 05/07/22  Please schedule an appointment at any time with Kennedy Bucker, LPN    If any questions, please contact me.  Thank you ,  Verlee Rossetti; Care Guide Ambulatory Clinical Support [place] [facility] Direct Dial: [phone]

## 2023-04-29 DIAGNOSIS — H43813 Vitreous degeneration, bilateral: Secondary | ICD-10-CM | POA: Diagnosis not present

## 2023-04-29 DIAGNOSIS — H353223 Exudative age-related macular degeneration, left eye, with inactive scar: Secondary | ICD-10-CM | POA: Diagnosis not present

## 2023-04-29 DIAGNOSIS — H353211 Exudative age-related macular degeneration, right eye, with active choroidal neovascularization: Secondary | ICD-10-CM | POA: Diagnosis not present

## 2023-04-29 DIAGNOSIS — H35373 Puckering of macula, bilateral: Secondary | ICD-10-CM | POA: Diagnosis not present

## 2023-04-29 DIAGNOSIS — H442E3 Degenerative myopia with other maculopathy, bilateral eye: Secondary | ICD-10-CM | POA: Diagnosis not present

## 2023-05-16 ENCOUNTER — Ambulatory Visit (INDEPENDENT_AMBULATORY_CARE_PROVIDER_SITE_OTHER): Payer: PPO

## 2023-05-16 VITALS — BP 120/72 | Ht 64.0 in | Wt 190.2 lb

## 2023-05-16 DIAGNOSIS — Z0000 Encounter for general adult medical examination without abnormal findings: Secondary | ICD-10-CM

## 2023-05-16 DIAGNOSIS — Z780 Asymptomatic menopausal state: Secondary | ICD-10-CM

## 2023-05-16 NOTE — Progress Notes (Signed)
Subjective:   HAILO VINEYARD is a 72 y.o. female who presents for Medicare Annual (Subsequent) preventive examination.  Review of Systems     Cardiac Risk Factors include: advanced age (>57men, >64 women)     Objective:    Today's Vitals   05/16/23 0943 05/16/23 0947  BP: 120/72   Weight: 190 lb 3.2 oz (86.3 kg)   Height: 5\' 4"  (1.626 m)   PainSc:  3    Body mass index is 32.65 kg/m.     05/16/2023    9:52 AM 08/27/2017    1:47 PM 08/13/2017   10:33 AM  Advanced Directives  Does Patient Have a Medical Advance Directive? Yes Yes Yes  Type of Estate agent of [place];Living will Healthcare Power of [place];Living will Healthcare Power of [place];Living will  Does patient want to make changes to medical advance directive? No - Patient declined    Copy of Healthcare Power of Attorney in Chart? Yes - validated most recent copy scanned in chart (See row information)      Current Medications (verified) Outpatient Encounter Medications as of 05/16/2023  Medication Sig   atorvastatin (LIPITOR) 10 MG tablet TAKE 1 TABLET BY MOUTH EVERY DAY   cetirizine (ZYRTEC) 10 MG tablet Take 10 mg by mouth daily.   latanoprost (XALATAN) 0.005 % ophthalmic solution 1 drop at bedtime.   Multiple Vitamins-Minerals (VISION FORMULA EYE HEALTH) CAPS Take 2 capsules by mouth daily.   Polyethyl Glycol-Propyl Glycol (SYSTANE OP) Apply 1 drop to eye 2 (two) times daily.    timolol (TIMOPTIC) 0.5 % ophthalmic solution SMARTSIG:In Eye(s)   omeprazole (PRILOSEC) 20 MG capsule TAKE 1 CAPSULE BY MOUTH EVERY DAY (Patient not taking: Reported on 05/16/2023)   [DISCONTINUED] Multiple Vitamins-Minerals (WOMENS MULTIVITAMIN PO) Take 1 tablet by mouth daily.   No facility-administered encounter medications on file as of 05/16/2023.    Allergies (verified) Phenobarbital and Codeine   History: Past Medical History:  Diagnosis Date   Allergy    Basal cell carcinoma    Chicken pox     GERD (gastroesophageal reflux disease)    Glaucoma    Heart murmur    History of blood transfusion    History of stomach ulcers    Hx of adenomatous polyp of colon 09/05/2017   Post-operative nausea and vomiting    "slow to wake up"   Past Surgical History:  Procedure Laterality Date   CATARACT EXTRACTION, BILATERAL     CESAREAN SECTION     MANDIBLE FRACTURE SURGERY  1973   TUBAL LIGATION     Family History  Problem Relation Age of Onset   Arthritis Mother    Uterine cancer Mother    Diabetes Mother    Heart disease Father    Cancer Maternal Uncle    Throat cancer Maternal Grandmother    Diabetes Maternal Grandmother    Prostate cancer Maternal Grandfather    Colon cancer Neg Hx    Social History   Socioeconomic History   Marital status: Divorced    Spouse name: Not on file   Number of children: 1   Years of education: college   Highest education level: Not on file  Occupational History   Occupation: Network engineer   Occupation: Optometrist  Tobacco Use   Smoking status: Former   Smokeless tobacco: Never  Building services engineer Use: Never used  Substance and Sexual Activity   Alcohol use: No   Drug use: No  Sexual activity: Not Currently  Other Topics Concern   Not on file  Social History Narrative   Not on file   Social Determinants of Health   Financial Resource Strain: Low Risk  (05/16/2023)   Overall Financial Resource Strain (CARDIA)    Difficulty of Paying Living Expenses: Not hard at all  Food Insecurity: No Food Insecurity (05/16/2023)   Hunger Vital Sign    Worried About Running Out of Food in the Last Year: Never true    Ran Out of Food in the Last Year: Never true  Transportation Needs: No Transportation Needs (05/16/2023)   PRAPARE - Administrator, Civil Service (Medical): No    Lack of Transportation (Non-Medical): No  Physical Activity: Insufficiently Active (05/16/2023)   Exercise Vital Sign    Days of Exercise per Week: 3  days    Minutes of Exercise per Session: 40 min  Stress: No Stress Concern Present (05/16/2023)   [employer] of Occupational Health - Occupational Stress Questionnaire    Feeling of Stress : Not at all  Social Connections: Moderately Integrated (05/07/2022)   Social Connection and Isolation Panel [NHANES]    Frequency of Communication with Friends and Family: More than three times a week    Frequency of Social Gatherings with Friends and Family: More than three times a week    Attends Religious Services: More than 4 times per year    Active Member of [employer] or Organizations: Yes    Attends Engineer, structural: More than 4 times per year    Marital Status: Divorced    Tobacco Counseling Counseling given: Not Answered   Clinical Intake:  Pre-visit preparation completed: Yes  Pain : 0-10 Pain Score: 3  Pain Type: Chronic pain Pain Location: Abdomen Pain Radiating Towards: painful gas d/t constipation     Nutritional Risks: None Diabetes: No  How often do you need to have someone help you when you read instructions, pamphlets, or other written materials from your doctor or pharmacy?: 1 - Never  Diabetic?no  Interpreter Needed?: No  Information entered by :: Kennedy Bucker, LPN   Activities of Daily Living    05/16/2023    9:53 AM  In your present state of health, do you have any difficulty performing the following activities:  Hearing? 0  Vision? 0  Difficulty concentrating or making decisions? 0  Walking or climbing stairs? 0  Dressing or bathing? 0  Doing errands, shopping? 0  Preparing Food and eating ? N  Using the Toilet? N  In the past six months, have you accidently leaked urine? N  Do you have problems with loss of bowel control? N  Managing your Medications? N  Managing your Finances? N  Housekeeping or managing your Housekeeping? N    Patient Care Team: Lorre Munroe, NP as PCP - General (Internal Medicine)  Indicate any recent  Medical Services you may have received from other than [facility] providers in the past year (date may be approximate).     Assessment:   This is a routine wellness examination for Keo.  Hearing/Vision screen Hearing Screening - Comments:: No aids Vision Screening - Comments:: Wears glasses- Dr.Dolan @ [facility]  Dietary issues and exercise activities discussed: Current Exercise Habits: Home exercise routine, Type of exercise: treadmill, Time (Minutes): 45, Frequency (Times/Week): 3, Weekly Exercise (Minutes/Week): 135, Intensity: Mild   Goals Addressed             This Visit's Progress  DIET - EAT MORE FRUITS AND VEGETABLES         Depression Screen    05/16/2023    9:51 AM 05/14/2022    1:14 PM 05/07/2022    1:21 PM 10/18/2021    9:17 AM 05/03/2021    2:04 PM 11/11/2019    2:40 PM 11/11/2019    2:38 PM  PHQ 2/9 Scores  PHQ - 2 Score 0 0 0 0 1 0 0  PHQ- 9 Score 0 0  2 3 0     Fall Risk    05/16/2023    9:53 AM 05/14/2022    1:14 PM 05/07/2022    7:26 AM 05/03/2021    2:04 PM 11/11/2019    2:39 PM  Fall Risk   Falls in the past year? 0 0 0 1 0  Number falls in past yr: 0 0 0 0 0  Injury with Fall? 0 0 0 0   Risk for fall due to : No Fall Risks No Fall Risks  History of fall(s)   Follow up Falls prevention discussed;Falls evaluation completed Falls evaluation completed  Falls evaluation completed     FALL RISK PREVENTION PERTAINING TO THE HOME:  Any stairs in or around the home? Yes  If so, are there any without handrails? No  Home free of loose throw rugs in walkways, pet beds, electrical cords, etc? Yes  Adequate lighting in your home to reduce risk of falls? Yes   ASSISTIVE DEVICES UTILIZED TO PREVENT FALLS:  Life alert? No  Use of a cane, walker or w/c? Yes - cane Grab bars in the bathroom? Yes  Shower chair or bench in shower? No  Elevated toilet seat or a handicapped toilet? No   TIMED UP AND GO:  Was the test performed? Yes .  Length of time  to ambulate 10 feet: 4 sec.   Gait steady and fast without use of assistive device  Cognitive Function:        05/16/2023   10:04 AM 05/07/2022    1:21 PM  6CIT Screen  What Year? 0 points 0 points  What month? 0 points 0 points  What time? 0 points 0 points  Count back from 20 0 points 0 points  Months in reverse 0 points 0 points  Repeat phrase 0 points 0 points  Total Score 0 points 0 points    Immunizations Immunization History  Administered Date(s) Administered   Influenza, High Dose Seasonal PF 10/02/2021   Influenza,inj,Quad PF,6+ Mos 09/10/2016, 02/26/2018, 11/11/2019   Influenza-Unspecified 10/16/2020, 09/10/2022   PFIZER(Purple Top)SARS-COV-2 Vaccination 02/13/2020, 03/07/2020, 10/16/2020, 03/30/2021, 10/02/2021   Pneumococcal Conjugate-13 07/17/2017   Pneumococcal Polysaccharide-23 11/11/2019   Respiratory Syncytial Virus Vaccine,Recomb Aduvanted(Arexvy) 01/14/2023   Tdap 01/14/2023   Zoster Recombinat (Shingrix) 10/16/2020    TDAP status: Up to date  Flu Vaccine status: Up to date  Pneumococcal vaccine status: Up to date  Covid-19 vaccine status: Completed vaccines  Qualifies for Shingles Vaccine? Yes   Zostavax completed No   Shingrix Completed?: No.    Education has been provided regarding the importance of this vaccine. Patient has been advised to call insurance company to determine out of pocket expense if they have not yet received this vaccine. Advised may also receive vaccine at local pharmacy or Health Dept. Verbalized acceptance and understanding.  Screening Tests Health Maintenance  Topic Date Due   MAMMOGRAM  Never done   DEXA SCAN  Never done   Zoster Vaccines- Shingrix (2 of  2) 12/11/2020   COVID-19 Vaccine (6 - 2023-24 season) 08/23/2022   INFLUENZA VACCINE  07/24/2023   Colonoscopy  08/27/2024   DTaP/Tdap/Td (2 - Td or Tdap) 01/14/2033   Pneumonia Vaccine 34+ Years old  Completed   Hepatitis C Screening  Completed   HPV VACCINES   Aged Out    Health Maintenance  Health Maintenance Due  Topic Date Due   MAMMOGRAM  Never done   DEXA SCAN  Never done   Zoster Vaccines- Shingrix (2 of 2) 12/11/2020   COVID-19 Vaccine (6 - 2023-24 season) 08/23/2022  Had 1st Shingrix 10/16/20  Colorectal cancer screening: Type of screening: Colonoscopy. Completed 08/27/17. Repeat every 7 years  Declined referral for mammogram  Bone Density status: Ordered 05/16/23. Pt provided with contact info and advised to call to schedule appt.  Lung Cancer Screening: (Low Dose CT Chest recommended if Age 89-80 years, 30 pack-year currently smoking OR have quit w/in 15years.) does not qualify.   Additional Screening:  Hepatitis C Screening: does qualify; Completed 07/17/17  Vision Screening: Recommended annual ophthalmology exams for early detection of glaucoma and other disorders of the eye. Is the patient up to date with their annual eye exam?  Yes  Who is the provider or what is the name of the office in which the patient attends annual eye exams? Dr.Dolan If pt is not established with a provider, would they like to be referred to a provider to establish care? No .   Dental Screening: Recommended annual dental exams for proper oral hygiene  Community Resource Referral / Chronic Care Management: CRR required this visit?  No   CCM required this visit?  No      Plan:     I have personally reviewed and noted the following in the patient's chart:   Medical and social history Use of alcohol, tobacco or illicit drugs  Current medications and supplements including opioid prescriptions. Patient is not currently taking opioid prescriptions. Functional ability and status Nutritional status Physical activity Advanced directives List of other physicians Hospitalizations, surgeries, and ER visits in previous 12 months Vitals Screenings to include cognitive, depression, and falls Referrals and appointments  In addition, I have reviewed  and discussed with patient certain preventive protocols, quality metrics, and best practice recommendations. A written personalized care plan for preventive services as well as general preventive health recommendations were provided to patient.     Hal Hope, LPN   03/31/8118   Nurse Notes: none

## 2023-05-16 NOTE — Patient Instructions (Signed)
Terri Crawford , Thank you for taking time to come for your Medicare Wellness Visit. I appreciate your ongoing commitment to your health goals. Please review the following plan we discussed and let me know if I can assist you in the future.   These are the goals we discussed:  Goals      Activity and Exercise Increased     Evidence-based guidance:  Review current exercise levels.  Assess patient perspective on exercise or activity level, barriers to increasing activity, motivation and readiness for change.  Recommend or set healthy exercise goal based on individual tolerance.  Encourage small steps toward making change in amount of exercise or activity.  Urge reduction of sedentary activities or screen time.  Promote group activities within the community or with family or support person.  Consider referral to rehabiliation therapist for assessment and exercise/activity plan.   Notes:      DIET - EAT MORE FRUITS AND VEGETABLES        This is a list of the screening recommended for you and due dates:  Health Maintenance  Topic Date Due   Mammogram  Never done   DEXA scan (bone density measurement)  Never done   Zoster (Shingles) Vaccine (2 of 2) 12/11/2020   COVID-19 Vaccine (6 - 2023-24 season) 08/23/2022   Flu Shot  07/24/2023   Colon Cancer Screening  08/27/2024   DTaP/Tdap/Td vaccine (2 - Td or Tdap) 01/14/2033   Pneumonia Vaccine  Completed   Hepatitis C Screening  Completed   HPV Vaccine  Aged Out    Advanced directives: no  Conditions/risks identified: none  Next appointment: Follow up in one year for your annual wellness visit 05/21/24 @ 9:15 am in person   Preventive Care 65 Years and Older, Female Preventive care refers to lifestyle choices and visits with your health care provider that can promote health and wellness. What does preventive care include? A yearly physical exam. This is also called an annual well check. Dental exams once or twice a year. Routine eye  exams. Ask your health care provider how often you should have your eyes checked. Personal lifestyle choices, including: Daily care of your teeth and gums. Regular physical activity. Eating a healthy diet. Avoiding tobacco and drug use. Limiting alcohol use. Practicing safe sex. Taking low-dose aspirin every day. Taking vitamin and mineral supplements as recommended by your health care provider. What happens during an annual well check? The services and screenings done by your health care provider during your annual well check will depend on your age, overall health, lifestyle risk factors, and family history of disease. Counseling  Your health care provider may ask you questions about your: Alcohol use. Tobacco use. Drug use. Emotional well-being. Home and relationship well-being. Sexual activity. Eating habits. History of falls. Memory and ability to understand (cognition). Work and work Astronomer. Reproductive health. Screening  You may have the following tests or measurements: Height, weight, and BMI. Blood pressure. Lipid and cholesterol levels. These may be checked every 5 years, or more frequently if you are over 37 years old. Skin check. Lung cancer screening. You may have this screening every year starting at age 46 if you have a 30-pack-year history of smoking and currently smoke or have quit within the past 15 years. Fecal occult blood test (FOBT) of the stool. You may have this test every year starting at age 61. Flexible sigmoidoscopy or colonoscopy. You may have a sigmoidoscopy every 5 years or a colonoscopy every 10 years  starting at age 65. Hepatitis C blood test. Hepatitis B blood test. Sexually transmitted disease (STD) testing. Diabetes screening. This is done by checking your blood sugar (glucose) after you have not eaten for a while (fasting). You may have this done every 1-3 years. Bone density scan. This is done to screen for osteoporosis. You may have  this done starting at age 60. Mammogram. This may be done every 1-2 years. Talk to your health care provider about how often you should have regular mammograms. Talk with your health care provider about your test results, treatment options, and if necessary, the need for more tests. Vaccines  Your health care provider may recommend certain vaccines, such as: Influenza vaccine. This is recommended every year. Tetanus, diphtheria, and acellular pertussis (Tdap, Td) vaccine. You may need a Td booster every 10 years. Zoster vaccine. You may need this after age 23. Pneumococcal 13-valent conjugate (PCV13) vaccine. One dose is recommended after age 22. Pneumococcal polysaccharide (PPSV23) vaccine. One dose is recommended after age 11. Talk to your health care provider about which screenings and vaccines you need and how often you need them. This information is not intended to replace advice given to you by your health care provider. Make sure you discuss any questions you have with your health care provider. Document Released: 01/05/2016 Document Revised: 08/28/2016 Document Reviewed: 10/10/2015 Elsevier Interactive Patient Education  2017 [employer].  Fall Prevention in the Home Falls can cause injuries. They can happen to people of all ages. There are many things you can do to make your home safe and to help prevent falls. What can I do on the outside of my home? Regularly fix the edges of walkways and driveways and fix any cracks. Remove anything that might make you trip as you walk through a door, such as a raised step or threshold. Trim any bushes or trees on the path to your home. Use bright outdoor lighting. Clear any walking paths of anything that might make someone trip, such as rocks or tools. Regularly check to see if handrails are loose or broken. Make sure that both sides of any steps have handrails. Any raised decks and porches should have guardrails on the edges. Have any leaves,  snow, or ice cleared regularly. Use sand or salt on walking paths during winter. Clean up any spills in your garage right away. This includes oil or grease spills. What can I do in the bathroom? Use night lights. Install grab bars by the toilet and in the tub and shower. Do not use towel bars as grab bars. Use non-skid mats or decals in the tub or shower. If you need to sit down in the shower, use a plastic, non-slip stool. Keep the floor dry. Clean up any water that spills on the floor as soon as it happens. Remove soap buildup in the tub or shower regularly. Attach bath mats securely with double-sided non-slip rug tape. Do not have throw rugs and other things on the floor that can make you trip. What can I do in the bedroom? Use night lights. Make sure that you have a light by your bed that is easy to reach. Do not use any sheets or blankets that are too big for your bed. They should not hang down onto the floor. Have a firm chair that has side arms. You can use this for support while you get dressed. Do not have throw rugs and other things on the floor that can make you trip. What  can I do in the kitchen? Clean up any spills right away. Avoid walking on wet floors. Keep items that you use a lot in easy-to-reach places. If you need to reach something above you, use a strong step stool that has a grab bar. Keep electrical cords out of the way. Do not use floor polish or wax that makes floors slippery. If you must use wax, use non-skid floor wax. Do not have throw rugs and other things on the floor that can make you trip. What can I do with my stairs? Do not leave any items on the stairs. Make sure that there are handrails on both sides of the stairs and use them. Fix handrails that are broken or loose. Make sure that handrails are as long as the stairways. Check any carpeting to make sure that it is firmly attached to the stairs. Fix any carpet that is loose or worn. Avoid having throw  rugs at the top or bottom of the stairs. If you do have throw rugs, attach them to the floor with carpet tape. Make sure that you have a light switch at the top of the stairs and the bottom of the stairs. If you do not have them, ask someone to add them for you. What else can I do to help prevent falls? Wear shoes that: Do not have high heels. Have rubber bottoms. Are comfortable and fit you well. Are closed at the toe. Do not wear sandals. If you use a stepladder: Make sure that it is fully opened. Do not climb a closed stepladder. Make sure that both sides of the stepladder are locked into place. Ask someone to hold it for you, if possible. Clearly mark and make sure that you can see: Any grab bars or handrails. First and last steps. Where the edge of each step is. Use tools that help you move around (mobility aids) if they are needed. These include: Canes. Walkers. Scooters. Crutches. Turn on the lights when you go into a dark area. Replace any light bulbs as soon as they burn out. Set up your furniture so you have a clear path. Avoid moving your furniture around. If any of your floors are uneven, fix them. If there are any pets around you, be aware of where they are. Review your medicines with your doctor. Some medicines can make you feel dizzy. This can increase your chance of falling. Ask your doctor what other things that you can do to help prevent falls. This information is not intended to replace advice given to you by your health care provider. Make sure you discuss any questions you have with your health care provider. Document Released: 10/05/2009 Document Revised: 05/16/2016 Document Reviewed: 01/13/2015 Elsevier Interactive Patient Education  2017 [employer].

## 2023-06-24 ENCOUNTER — Other Ambulatory Visit: Payer: Self-pay | Admitting: Internal Medicine

## 2023-06-24 DIAGNOSIS — E7800 Pure hypercholesterolemia, unspecified: Secondary | ICD-10-CM

## 2023-06-25 NOTE — Telephone Encounter (Signed)
Requested Prescriptions  Pending Prescriptions Disp Refills   atorvastatin (LIPITOR) 10 MG tablet [Pharmacy Med Name: ATORVASTATIN 10 MG TABLET] 90 tablet 0    Sig: TAKE 1 TABLET BY MOUTH EVERY DAY     Cardiovascular:  Antilipid - Statins Failed - 06/24/2023 12:46 PM      Failed - Lipid Panel in normal range within the last 12 months    Cholesterol  Date Value Ref Range Status  05/14/2022 197 <200 mg/dL Final   LDL Cholesterol (Calc)  Date Value Ref Range Status  05/14/2022 104 (H) mg/dL (calc) Final    Comment:    Reference range: <100 . Desirable range <100 mg/dL for primary prevention;   <70 mg/dL for patients with CHD or diabetic patients  with > or = 2 CHD risk factors. Marland Kitchen LDL-C is now calculated using the Martin-Hopkins  calculation, which is a validated novel method providing  better accuracy than the Friedewald equation in the  estimation of LDL-C.  Horald Pollen et al. Lenox Ahr. 1914;782(95): 2061-2068  (http://education.[employer].com/faq/FAQ164)    HDL  Date Value Ref Range Status  05/14/2022 68 > OR = 50 mg/dL Final   Triglycerides  Date Value Ref Range Status  05/14/2022 155 (H) <150 mg/dL Final         Passed - Patient is not pregnant      Passed - Valid encounter within last 12 months    Recent Outpatient Visits           1 year ago Pure hypercholesterolemia   [facility] [place], [place] W, NP   1 year ago Iron deficiency anemia, unspecified iron deficiency anemia type   [facility] [place], Salvadore [place], NP   1 year ago Anemia, unspecified type   [facility] [place], Salvadore Oxford, NP   2 years ago Medicare annual wellness visit, subsequent   [facility] [place], Salvadore Oxford, NP   2 years ago Viral sinusitis   [place] [facility] [place], Salvadore [place], [place]

## 2023-07-22 DIAGNOSIS — H442E3 Degenerative myopia with other maculopathy, bilateral eye: Secondary | ICD-10-CM | POA: Diagnosis not present

## 2023-07-22 DIAGNOSIS — H43813 Vitreous degeneration, bilateral: Secondary | ICD-10-CM | POA: Diagnosis not present

## 2023-07-22 DIAGNOSIS — H353211 Exudative age-related macular degeneration, right eye, with active choroidal neovascularization: Secondary | ICD-10-CM | POA: Diagnosis not present

## 2023-07-22 DIAGNOSIS — H35373 Puckering of macula, bilateral: Secondary | ICD-10-CM | POA: Diagnosis not present

## 2023-07-22 DIAGNOSIS — H353223 Exudative age-related macular degeneration, left eye, with inactive scar: Secondary | ICD-10-CM | POA: Diagnosis not present

## 2023-08-11 ENCOUNTER — Ambulatory Visit: Payer: Self-pay | Admitting: *Deleted

## 2023-08-11 ENCOUNTER — Encounter: Payer: Self-pay | Admitting: Internal Medicine

## 2023-08-11 NOTE — Telephone Encounter (Signed)
  Chief Complaint: Constipation, bloating,gas Symptoms: Reports ongoing episodes of constipation, bloating, gas, lower abdominal cramping. Intermittent. LBM Friday "Adequate." States has been seen in past for issues. Has been eating "All the right things,Probiotics, eliminating some foods. Still a lot of gas, bloating and discomfort." Also reports fatigue Frequency: "Long time" Months Pertinent Negatives: Patient denies   Disposition: [] ED /[] Urgent Care (no appt availability in office) / [x] Appointment(In office/virtual)/ []  [facility]/ [] Home Care/ [] Refused Recommended Disposition /[] [facility]/ []  Follow-up with PCP Additional Notes: Secured first available per pt's schedule, does not want to drive in traffic.  Placed on wait list. Care advise provided, pt verbalizes understanding.   Reason for Disposition  Abdominal pain is a chronic symptom (recurrent or ongoing AND present > 4 weeks)  Answer Assessment - Initial Assessment Questions 1. LOCATION: "Where does it hurt?"      Lower 2. RADIATION: "Does the pain shoot anywhere else?" (e.g., chest, back)     No 3. ONSET: "When did the pain begin?" (e.g., minutes, hours or days ago)      Seen in past 4. SUDDEN: "Gradual or sudden onset?"     Gradual 5. PATTERN "Does the pain come and go, or is it constant?"    - If it comes and goes: "How long does it last?" "Do you have pain now?"     (Note: Comes and goes means the pain is intermittent. It goes away completely between bouts.)    - If constant: "Is it getting better, staying the same, or getting worse?"      (Note: Constant means the pain never goes away completely; most serious pain is constant and gets worse.)      Comes and goes 6. SEVERITY: "How bad is the pain?"  (e.g., Scale 1-10; mild, moderate, or severe)    - MILD (1-3): Doesn't interfere with normal activities, abdomen soft and not tender to touch.     - MODERATE (4-7): Interferes with normal activities  or awakens from sleep, abdomen tender to touch.     - SEVERE (8-10): Excruciating pain, doubled over, unable to do any normal activities.       Mild, worse with gas 7. RECURRENT SYMPTOM: "Have you ever had this type of stomach pain before?" If Yes, ask: "When was the last time?" and "What happened that time?"       8. CAUSE: "What do you think is causing the stomach pain?"      9. RELIEVING/AGGRAVATING FACTORS: "What makes it better or worse?" (e.g., antacids, bending or twisting motion, bowel movement)      10. OTHER SYMPTOMS: "Do you have any other symptoms?" (e.g., back pain, diarrhea, fever, urination pain, vomiting)       Bloating, constipation ongoing, gassy. LBM Friday "Adequate one."  Protocols used: Abdominal Pain - Female-A-AH

## 2023-08-12 DIAGNOSIS — H40003 Preglaucoma, unspecified, bilateral: Secondary | ICD-10-CM | POA: Diagnosis not present

## 2023-08-15 ENCOUNTER — Other Ambulatory Visit: Payer: 59

## 2023-08-20 ENCOUNTER — Ambulatory Visit (INDEPENDENT_AMBULATORY_CARE_PROVIDER_SITE_OTHER): Payer: PPO | Admitting: Internal Medicine

## 2023-08-20 ENCOUNTER — Encounter: Payer: Self-pay | Admitting: Internal Medicine

## 2023-08-20 VITALS — BP 124/74 | HR 80 | Temp 96.8°F | Wt 197.0 lb

## 2023-08-20 DIAGNOSIS — E7800 Pure hypercholesterolemia, unspecified: Secondary | ICD-10-CM | POA: Diagnosis not present

## 2023-08-20 DIAGNOSIS — R7303 Prediabetes: Secondary | ICD-10-CM

## 2023-08-20 DIAGNOSIS — E6609 Other obesity due to excess calories: Secondary | ICD-10-CM | POA: Diagnosis not present

## 2023-08-20 DIAGNOSIS — K2100 Gastro-esophageal reflux disease with esophagitis, without bleeding: Secondary | ICD-10-CM

## 2023-08-20 DIAGNOSIS — K5909 Other constipation: Secondary | ICD-10-CM | POA: Insufficient documentation

## 2023-08-20 DIAGNOSIS — Z6833 Body mass index (BMI) 33.0-33.9, adult: Secondary | ICD-10-CM

## 2023-08-20 DIAGNOSIS — F320 Major depressive disorder, single episode, mild: Secondary | ICD-10-CM

## 2023-08-20 NOTE — Assessment & Plan Note (Signed)
Stable off meds Support offered 

## 2023-08-20 NOTE — Assessment & Plan Note (Signed)
A1c today Encourage low-carb diet and exercise for weight loss 

## 2023-08-20 NOTE — Assessment & Plan Note (Signed)
C-Met and lipid profile today Encouraged her to consume a low-fat diet Continue atorvastatin 

## 2023-08-20 NOTE — Progress Notes (Signed)
Subjective:    Patient ID: Terri Crawford, female    DOB: 1951/01/09, 72 y.o.   MRN: [id number]  HPI  Patient presents to clinic today for follow-up of chronic conditions.  GERD: She is not sure what triggers this. She has had a recent increase in symptoms which she thinks is being caused by constipation.  She takes omeprazole (or something similar).  There is no upper GI on file.  HLD: Her last LDL was 104, triglycerides 155, 04/2022.  She denies myalgias on atorvastatin.  She tries to consume low-fat diet.  Depression: Currently not an issue.  She is not taking any medications for this.  She is not currently seeing a therapist.  She denies anxiety, SI/HI.  Prediabetes: Her last A1c was 5.7%, 04/2022.  She is not taking any oral diabetic medication at this time.  She does not check her sugars.  She also reports constipation. She noticed this over the last month. She typically has a BM every 3-4 days. If she takes mirilax, she will have a BM daily. When she is constipated, she  reports abdominal bloating and fatigue.  Review of Systems     Past Medical History:  Diagnosis Date   Allergy    Basal cell carcinoma    Chicken pox    GERD (gastroesophageal reflux disease)    Glaucoma    Heart murmur    History of blood transfusion    History of stomach ulcers    Hx of adenomatous polyp of colon 09/05/2017   Post-operative nausea and vomiting    "slow to wake up"    Current Outpatient Medications  Medication Sig Dispense Refill   atorvastatin (LIPITOR) 10 MG tablet TAKE 1 TABLET BY MOUTH EVERY DAY 90 tablet 0   cetirizine (ZYRTEC) 10 MG tablet Take 10 mg by mouth daily.     latanoprost (XALATAN) 0.005 % ophthalmic solution 1 drop at bedtime.     Multiple Vitamins-Minerals (VISION FORMULA EYE HEALTH) CAPS Take 2 capsules by mouth daily.     omeprazole (PRILOSEC) 20 MG capsule TAKE 1 CAPSULE BY MOUTH EVERY DAY (Patient not taking: Reported on 05/16/2023) 90 capsule 0   Polyethyl  Glycol-Propyl Glycol (SYSTANE OP) Apply 1 drop to eye 2 (two) times daily.      timolol (TIMOPTIC) 0.5 % ophthalmic solution SMARTSIG:In Eye(s)     No current facility-administered medications for this visit.    Allergies  Allergen Reactions   Phenobarbital Other (See Comments)    syncope   Codeine Nausea And Vomiting    Family History  Problem Relation Age of Onset   Arthritis Mother    Uterine cancer Mother    Diabetes Mother    Heart disease Father    Cancer Maternal Uncle    Throat cancer Maternal Grandmother    Diabetes Maternal Grandmother    Prostate cancer Maternal Grandfather    Colon cancer Neg Hx     Social History   Socioeconomic History   Marital status: Divorced    Spouse name: Not on file   Number of children: 1   Years of education: college   Highest education level: Some college, no degree  Occupational History   Occupation: Network engineer   Occupation: Optometrist  Tobacco Use   Smoking status: Former   Smokeless tobacco: Never  Advertising account planner   Vaping status: Never Used  Substance and Sexual Activity   Alcohol use: No   Drug use: No   Sexual activity: Not  Currently  Other Topics Concern   Not on file  Social History Narrative   Not on file   Social Determinants of Health   Financial Resource Strain: Low Risk  (08/18/2023)   Overall Financial Resource Strain (CARDIA)    Difficulty of Paying Living Expenses: Not very hard  Food Insecurity: No Food Insecurity (08/18/2023)   Hunger Vital Sign    Worried About Running Out of Food in the Last Year: Never true    Ran Out of Food in the Last Year: Never true  Transportation Needs: No Transportation Needs (08/18/2023)   PRAPARE - Administrator, Civil Service (Medical): No    Lack of Transportation (Non-Medical): No  Physical Activity: Insufficiently Active (08/18/2023)   Exercise Vital Sign    Days of Exercise per Week: 3 days    Minutes of Exercise per Session: 30 min  Stress: No  Stress Concern Present (08/18/2023)   [employer] of Occupational Health - Occupational Stress Questionnaire    Feeling of Stress : Not at all  Social Connections: Unknown (08/18/2023)   Social Connection and Isolation Panel [NHANES]    Frequency of Communication with Friends and Family: More than three times a week    Frequency of Social Gatherings with Friends and Family: More than three times a week    Attends Religious Services: More than 4 times per year    Active Member of [employer] or Organizations: Not on file    Attends Banker Meetings: Not on file    Marital Status: Divorced  Intimate Partner Violence: Not At Risk (05/16/2023)   Humiliation, Afraid, Rape, and Kick questionnaire    Fear of Current or Ex-Partner: No    Emotionally Abused: No    Physically Abused: No    Sexually Abused: No     Constitutional: Denies fever, malaise, fatigue, headache or abrupt weight changes.  HEENT: Denies eye pain, eye redness, ear pain, ringing in the ears, wax buildup, runny nose, nasal congestion, bloody nose, or sore throat. Respiratory: Denies difficulty breathing, shortness of breath, cough or sputum production.   Cardiovascular: Denies chest pain, chest tightness, palpitations or swelling in the hands or feet.  Gastrointestinal: Patient reports abdominal bloating, constipation.  Denies abdominal pain, bloating, diarrhea or blood in the stool.  GU: Denies urgency, frequency, pain with urination, burning sensation, blood in urine, odor or discharge. Musculoskeletal: Denies decrease in range of motion, difficulty with gait, muscle pain or joint pain and swelling.  Skin: Denies redness, rashes, lesions or ulcercations.  Neurological: Denies dizziness, difficulty with memory, difficulty with speech or problems with balance and coordination.  Psych: Patient has a history of depression.  Denies anxiety, SI/HI.  No other specific complaints in a complete review of systems (except as  listed in HPI above).  Objective:   Physical Exam   BP 124/74 (BP Location: Left Arm, Patient Position: Sitting, Cuff Size: Normal)   Pulse 80   Temp (!) 96.8 F (36 C) (Temporal)   Wt 197 lb (89.4 kg)   BMI 33.81 kg/m   Wt Readings from Last 3 Encounters:  05/16/23 190 lb 3.2 oz (86.3 kg)  05/14/22 190 lb (86.2 kg)  05/07/22 190 lb 8 oz (86.4 kg)    General: Appears her stated age, obese, in NAD. Skin: Warm, dry and intact. HEENT: Head: normal shape and size; Eyes: sclera white, no icterus, conjunctiva pink, PERRLA and EOMs intact;  Cardiovascular: Normal rate and rhythm. S1,S2 noted.  No murmur, rubs  or gallops noted. No JVD or BLE edema. No carotid bruits noted. Pulmonary/Chest: Normal effort and positive vesicular breath sounds. No respiratory distress. No wheezes, rales or ronchi noted.  Abdomen:  Soft and nontender. Hypoactive bowel sounds.  Musculoskeletal: No difficulty with gait.  Neurological: Alert and oriented.  Coordination normal.  Psychiatric: Mood and affect normal. Behavior is normal. Judgment and thought content normal.    BMET    Component Value Date/Time   NA 136 05/14/2022 1324   K 4.3 05/14/2022 1324   CL 100 05/14/2022 1324   CO2 27 05/14/2022 1324   GLUCOSE 101 05/14/2022 1324   BUN 18 05/14/2022 1324   CREATININE 0.55 (L) 05/14/2022 1324   CALCIUM 9.5 05/14/2022 1324    Lipid Panel     Component Value Date/Time   CHOL 197 05/14/2022 1324   TRIG 155 (H) 05/14/2022 1324   HDL 68 05/14/2022 1324   CHOLHDL 2.9 05/14/2022 1324   VLDL 14.2 02/25/2020 0841   LDLCALC 104 (H) 05/14/2022 1324    CBC    Component Value Date/Time   WBC 7.1 05/14/2022 1324   RBC 4.14 05/14/2022 1324   HGB 11.9 05/14/2022 1324   HCT 36.5 05/14/2022 1324   PLT 342 05/14/2022 1324   MCV 88.2 05/14/2022 1324   MCH 28.7 05/14/2022 1324   MCHC 32.6 05/14/2022 1324   RDW 14.5 05/14/2022 1324   LYMPHSABS 1,380 08/16/2021 1354   EOSABS 117 08/16/2021 1354    BASOSABS 37 08/16/2021 1354    Hgb A1C Lab Results  Component Value Date   HGBA1C 5.7 (H) 05/14/2022           Assessment & Plan:     Schedule an appointment for your annual exam Nicki Reaper, NP

## 2023-08-20 NOTE — Assessment & Plan Note (Signed)
Encourage diet and exercise for weight loss 

## 2023-08-20 NOTE — Patient Instructions (Signed)

## 2023-08-20 NOTE — Assessment & Plan Note (Signed)
Encouraged high-fiber diet and adequate water intake Advised her to eat Activia and take MiraLAX daily

## 2023-08-20 NOTE — Assessment & Plan Note (Signed)
Encourage weight loss as this can help reduce reflux symptoms Continue omeprazole OTC as needed

## 2023-08-21 LAB — COMPLETE METABOLIC PANEL WITH GFR
AG Ratio: 1.6 (calc) (ref 1.0–2.5)
ALT: 11 U/L (ref 6–29)
AST: 13 U/L (ref 10–35)
Albumin: 4.3 g/dL (ref 3.6–5.1)
Alkaline phosphatase (APISO): 70 U/L (ref 37–153)
BUN/Creatinine Ratio: 36 (calc) — ABNORMAL HIGH (ref 6–22)
BUN: 18 mg/dL (ref 7–25)
CO2: 28 mmol/L (ref 20–32)
Calcium: 9.9 mg/dL (ref 8.6–10.4)
Chloride: 98 mmol/L (ref 98–110)
Creat: 0.5 mg/dL — ABNORMAL LOW (ref 0.60–1.00)
Globulin: 2.7 g/dL (ref 1.9–3.7)
Glucose, Bld: 87 mg/dL (ref 65–139)
Potassium: 4.5 mmol/L (ref 3.5–5.3)
Sodium: 136 mmol/L (ref 135–146)
Total Bilirubin: 0.4 mg/dL (ref 0.2–1.2)
Total Protein: 7 g/dL (ref 6.1–8.1)
eGFR: 100 mL/min/{1.73_m2} (ref >=60)

## 2023-08-21 LAB — CBC
HCT: 37 % (ref 35.0–45.0)
Hemoglobin: 12 g/dL (ref 11.7–15.5)
MCH: 29.8 pg (ref 27.0–33.0)
MCHC: 32.4 g/dL (ref 32.0–36.0)
MCV: 91.8 fL (ref 80.0–100.0)
MPV: 10.5 fL (ref 7.5–12.5)
Platelets: 310 10*3/uL (ref 140–400)
RBC: 4.03 10*6/uL (ref 3.80–5.10)
RDW: 13.1 % (ref 11.0–15.0)
WBC: 8.6 10*3/uL (ref 3.8–10.8)

## 2023-08-21 LAB — LIPID PANEL
Cholesterol: 202 mg/dL — ABNORMAL HIGH (ref <200)
HDL: 76 mg/dL (ref >=50)
LDL Cholesterol (Calc): 105 mg/dL — ABNORMAL HIGH
Non-HDL Cholesterol (Calc): 126 mg/dL (ref <130)
Total CHOL/HDL Ratio: 2.7 (calc) (ref <5.0)
Triglycerides: 115 mg/dL (ref <150)

## 2023-08-21 LAB — HEMOGLOBIN A1C
Hgb A1c MFr Bld: 5.8 %{Hb} — ABNORMAL HIGH (ref <5.7)
Mean Plasma Glucose: 120 mg/dL
eAG (mmol/L): 6.6 mmol/L

## 2023-08-24 ENCOUNTER — Encounter: Payer: Self-pay | Admitting: Internal Medicine

## 2023-08-26 MED ORDER — ATORVASTATIN CALCIUM 20 MG PO TABS: 20.0000 mg | ORAL_TABLET | Freq: Every day | ORAL | 1 refills | Status: DC

## 2023-09-21 ENCOUNTER — Other Ambulatory Visit: Payer: Self-pay | Admitting: Internal Medicine

## 2023-09-21 DIAGNOSIS — E7800 Pure hypercholesterolemia, unspecified: Secondary | ICD-10-CM

## 2023-09-22 NOTE — Telephone Encounter (Signed)
Unable to refill per protocol, Rx expired. Discontinued 08/26/23.  Requested Prescriptions  Pending Prescriptions Disp Refills   atorvastatin (LIPITOR) 10 MG tablet [Pharmacy Med Name: ATORVASTATIN 10 MG TABLET] 90 tablet 0    Sig: TAKE 1 TABLET BY MOUTH EVERY DAY     Cardiovascular:  Antilipid - Statins Failed - 09/21/2023  8:32 AM      Failed - Lipid Panel in normal range within the last 12 months    Cholesterol  Date Value Ref Range Status  08/20/2023 202 (H) <200 mg/dL Final   LDL Cholesterol (Calc)  Date Value Ref Range Status  08/20/2023 105 (H) mg/dL (calc) Final    Comment:    Reference range: <100 . Desirable range <100 mg/dL for primary prevention;   <70 mg/dL for patients with CHD or diabetic patients  with > or = 2 CHD risk factors. Marland Kitchen LDL-C is now calculated using the Martin-Hopkins  calculation, which is a validated novel method providing  better accuracy than the Friedewald equation in the  estimation of LDL-C.  Horald Pollen et al. Lenox Ahr. 6440;347(42): 2061-2068  (http://education.[facility].com/faq/FAQ164)    HDL  Date Value Ref Range Status  08/20/2023 76 > OR = 50 mg/dL Final   Triglycerides  Date Value Ref Range Status  08/20/2023 115 <150 mg/dL Final         Passed - Patient is not pregnant      Passed - Valid encounter within last 12 months    Recent Outpatient Visits           1 month ago Chronic constipation   [facility] [place], Salvadore Oxford, NP   1 year ago Pure hypercholesterolemia   [place] [place], [place] W, NP   1 year ago Iron deficiency anemia, unspecified iron deficiency anemia type   [facility] [place], Salvadore Oxford, NP   2 years ago Anemia, unspecified type   [facility] [place], Salvadore Oxford, NP   2 years ago Medicare annual wellness visit, subsequent   [place] [facility] [place], Salvadore Oxford, NP       Future  Appointments             In 5 months Ba[place], Salvadore Oxford, NP [facility], [facility]

## 2023-11-28 DIAGNOSIS — H353223 Exudative age-related macular degeneration, left eye, with inactive scar: Secondary | ICD-10-CM | POA: Diagnosis not present

## 2023-11-28 DIAGNOSIS — H442E3 Degenerative myopia with other maculopathy, bilateral eye: Secondary | ICD-10-CM | POA: Diagnosis not present

## 2023-11-28 DIAGNOSIS — H35373 Puckering of macula, bilateral: Secondary | ICD-10-CM | POA: Diagnosis not present

## 2023-11-28 DIAGNOSIS — H353211 Exudative age-related macular degeneration, right eye, with active choroidal neovascularization: Secondary | ICD-10-CM | POA: Diagnosis not present

## 2023-11-28 DIAGNOSIS — H43813 Vitreous degeneration, bilateral: Secondary | ICD-10-CM | POA: Diagnosis not present

## 2024-02-10 DIAGNOSIS — H353134 Nonexudative age-related macular degeneration, bilateral, advanced atrophic with subfoveal involvement: Secondary | ICD-10-CM | POA: Diagnosis not present

## 2024-02-10 DIAGNOSIS — H40003 Preglaucoma, unspecified, bilateral: Secondary | ICD-10-CM | POA: Diagnosis not present

## 2024-02-18 ENCOUNTER — Other Ambulatory Visit: Payer: Self-pay | Admitting: Internal Medicine

## 2024-02-18 NOTE — Telephone Encounter (Signed)
 Requested Prescriptions  Pending Prescriptions Disp Refills   atorvastatin (LIPITOR) 20 MG tablet [Pharmacy Med Name: ATORVASTATIN 20 MG TABLET] 90 tablet 0    Sig: TAKE 1 TABLET BY MOUTH EVERY DAY     Cardiovascular:  Antilipid - Statins Failed - 02/18/2024  3:33 PM      Failed - Lipid Panel in normal range within the last 12 months    Cholesterol  Date Value Ref Range Status  08/20/2023 202 (H) <200 mg/dL Final   LDL Cholesterol (Calc)  Date Value Ref Range Status  08/20/2023 105 (H) mg/dL (calc) Final    Comment:    Reference range: <100 . Desirable range <100 mg/dL for primary prevention;   <70 mg/dL for patients with CHD or diabetic patients  with > or = 2 CHD risk factors. Marland Kitchen LDL-C is now calculated using the Martin-Hopkins  calculation, which is a validated novel method providing  better accuracy than the Friedewald equation in the  estimation of LDL-C.  Horald Pollen et al. Lenox Ahr. 1610;960(45): 2061-2068  (http://education.[facility].com/faq/FAQ164)    HDL  Date Value Ref Range Status  08/20/2023 76 > OR = 50 mg/dL Final   Triglycerides  Date Value Ref Range Status  08/20/2023 115 <150 mg/dL Final         Passed - Patient is not pregnant      Passed - Valid encounter within last 12 months    Recent Outpatient Visits           6 months ago Chronic constipation   [facility] [place], Salvadore Oxford, NP   1 year ago Pure hypercholesterolemia   [facility] [place], [place], NP   2 years ago Iron deficiency anemia, unspecified iron deficiency anemia type   [place] [facility] [place], Salvadore Oxford, NP   2 years ago Anemia, unspecified type   [facility] [place], Salvadore Oxford, NP   2 years ago Medicare annual wellness visit, subsequent   [facility] [place], Salvadore Oxford, [place]

## 2024-02-20 ENCOUNTER — Encounter: Payer: PPO | Admitting: Internal Medicine

## 2024-03-24 DIAGNOSIS — D2271 Melanocytic nevi of right lower limb, including hip: Secondary | ICD-10-CM | POA: Diagnosis not present

## 2024-03-24 DIAGNOSIS — L2989 Other pruritus: Secondary | ICD-10-CM | POA: Diagnosis not present

## 2024-03-24 DIAGNOSIS — Z08 Encounter for follow-up examination after completed treatment for malignant neoplasm: Secondary | ICD-10-CM | POA: Diagnosis not present

## 2024-03-24 DIAGNOSIS — D2272 Melanocytic nevi of left lower limb, including hip: Secondary | ICD-10-CM | POA: Diagnosis not present

## 2024-03-24 DIAGNOSIS — C44619 Basal cell carcinoma of skin of left upper limb, including shoulder: Secondary | ICD-10-CM | POA: Diagnosis not present

## 2024-03-24 DIAGNOSIS — C44319 Basal cell carcinoma of skin of other parts of face: Secondary | ICD-10-CM | POA: Diagnosis not present

## 2024-03-24 DIAGNOSIS — D225 Melanocytic nevi of trunk: Secondary | ICD-10-CM | POA: Diagnosis not present

## 2024-03-24 DIAGNOSIS — D045 Carcinoma in situ of skin of trunk: Secondary | ICD-10-CM | POA: Diagnosis not present

## 2024-03-24 DIAGNOSIS — D2261 Melanocytic nevi of right upper limb, including shoulder: Secondary | ICD-10-CM | POA: Diagnosis not present

## 2024-03-24 DIAGNOSIS — D2262 Melanocytic nevi of left upper limb, including shoulder: Secondary | ICD-10-CM | POA: Diagnosis not present

## 2024-03-24 DIAGNOSIS — D485 Neoplasm of uncertain behavior of skin: Secondary | ICD-10-CM | POA: Diagnosis not present

## 2024-03-24 DIAGNOSIS — L538 Other specified erythematous conditions: Secondary | ICD-10-CM | POA: Diagnosis not present

## 2024-03-24 DIAGNOSIS — L820 Inflamed seborrheic keratosis: Secondary | ICD-10-CM | POA: Diagnosis not present

## 2024-03-24 DIAGNOSIS — L821 Other seborrheic keratosis: Secondary | ICD-10-CM | POA: Diagnosis not present

## 2024-03-24 DIAGNOSIS — L570 Actinic keratosis: Secondary | ICD-10-CM | POA: Diagnosis not present

## 2024-03-24 DIAGNOSIS — Z85828 Personal history of other malignant neoplasm of skin: Secondary | ICD-10-CM | POA: Diagnosis not present

## 2024-04-05 DIAGNOSIS — H353211 Exudative age-related macular degeneration, right eye, with active choroidal neovascularization: Secondary | ICD-10-CM | POA: Diagnosis not present

## 2024-04-05 DIAGNOSIS — H15833 Staphyloma posticum, bilateral: Secondary | ICD-10-CM | POA: Diagnosis not present

## 2024-04-05 DIAGNOSIS — H353223 Exudative age-related macular degeneration, left eye, with inactive scar: Secondary | ICD-10-CM | POA: Diagnosis not present

## 2024-04-05 DIAGNOSIS — H442E3 Degenerative myopia with other maculopathy, bilateral eye: Secondary | ICD-10-CM | POA: Diagnosis not present

## 2024-04-05 DIAGNOSIS — H43813 Vitreous degeneration, bilateral: Secondary | ICD-10-CM | POA: Diagnosis not present

## 2024-04-05 DIAGNOSIS — H35373 Puckering of macula, bilateral: Secondary | ICD-10-CM | POA: Diagnosis not present

## 2024-04-20 ENCOUNTER — Telehealth: Payer: Self-pay | Admitting: Internal Medicine

## 2024-04-20 NOTE — Telephone Encounter (Signed)
 Copied from CRM [id number]. Topic: Appointments - Transfer of Care >> Apr 20, 2024  9:18 AM [name] H wrote: Pt is requesting to transfer FROM: Terri Crawford Pt is requesting to transfer TO: Felicita Horns Reason for requested transfer: Location is closer to home It is the responsibility of the team the patient would like to transfer to (Dr. Felicita Horns) to reach out to the patient if for any reason this transfer is not acceptable.

## 2024-04-20 NOTE — Telephone Encounter (Signed)
 Okay with me

## 2024-04-20 NOTE — Telephone Encounter (Signed)
 TOC is ok with me.

## 2024-05-18 DIAGNOSIS — H35373 Puckering of macula, bilateral: Secondary | ICD-10-CM | POA: Diagnosis not present

## 2024-05-18 DIAGNOSIS — H353211 Exudative age-related macular degeneration, right eye, with active choroidal neovascularization: Secondary | ICD-10-CM | POA: Diagnosis not present

## 2024-05-18 DIAGNOSIS — H43813 Vitreous degeneration, bilateral: Secondary | ICD-10-CM | POA: Diagnosis not present

## 2024-05-18 DIAGNOSIS — H442E3 Degenerative myopia with other maculopathy, bilateral eye: Secondary | ICD-10-CM | POA: Diagnosis not present

## 2024-05-18 DIAGNOSIS — H353223 Exudative age-related macular degeneration, left eye, with inactive scar: Secondary | ICD-10-CM | POA: Diagnosis not present

## 2024-05-19 DIAGNOSIS — C44319 Basal cell carcinoma of skin of other parts of face: Secondary | ICD-10-CM | POA: Diagnosis not present

## 2024-05-23 ENCOUNTER — Other Ambulatory Visit: Payer: Self-pay | Admitting: Internal Medicine

## 2024-05-24 NOTE — Telephone Encounter (Signed)
 Requested Prescriptions  Pending Prescriptions Disp Refills   atorvastatin  (LIPITOR) 20 MG tablet [Pharmacy Med Name: ATORVASTATIN  20 MG TABLET] 90 tablet 0    Sig: TAKE 1 TABLET BY MOUTH EVERY DAY     Cardiovascular:  Antilipid - Statins Failed - 05/24/2024  4:37 PM      Failed - Valid encounter within last 12 months    Recent Outpatient Visits   None            Failed - Lipid Panel in normal range within the last 12 months    Cholesterol  Date Value Ref Range Status  08/20/2023 202 (H) <200 mg/dL Final   LDL Cholesterol (Calc)  Date Value Ref Range Status  08/20/2023 105 (H) mg/dL (calc) Final    Comment:    Reference range: <100 . Desirable range <100 mg/dL for primary prevention;   <70 mg/dL for patients with CHD or diabetic patients  with > or = 2 CHD risk factors. Aaron Aas LDL-C is now calculated using the Martin-Hopkins  calculation, which is a validated novel method providing  better accuracy than the Friedewald equation in the  estimation of LDL-C.  Melinda Sprawls et al. Erroll Heard. 1610;960(45): 2061-2068  (http://education.[facility].com/faq/FAQ164)    HDL  Date Value Ref Range Status  08/20/2023 76 > OR = 50 mg/dL Final   Triglycerides  Date Value Ref Range Status  08/20/2023 115 <150 mg/dL Final         Passed - Patient is not pregnant

## 2024-05-26 DIAGNOSIS — C44619 Basal cell carcinoma of skin of left upper limb, including shoulder: Secondary | ICD-10-CM | POA: Diagnosis not present

## 2024-05-26 DIAGNOSIS — L570 Actinic keratosis: Secondary | ICD-10-CM | POA: Diagnosis not present

## 2024-05-26 DIAGNOSIS — D045 Carcinoma in situ of skin of trunk: Secondary | ICD-10-CM | POA: Diagnosis not present

## 2024-07-07 DIAGNOSIS — H43813 Vitreous degeneration, bilateral: Secondary | ICD-10-CM | POA: Diagnosis not present

## 2024-07-07 DIAGNOSIS — H353223 Exudative age-related macular degeneration, left eye, with inactive scar: Secondary | ICD-10-CM | POA: Diagnosis not present

## 2024-07-07 DIAGNOSIS — H35373 Puckering of macula, bilateral: Secondary | ICD-10-CM | POA: Diagnosis not present

## 2024-07-07 DIAGNOSIS — H15833 Staphyloma posticum, bilateral: Secondary | ICD-10-CM | POA: Diagnosis not present

## 2024-07-07 DIAGNOSIS — H353211 Exudative age-related macular degeneration, right eye, with active choroidal neovascularization: Secondary | ICD-10-CM | POA: Diagnosis not present

## 2024-07-07 DIAGNOSIS — H442E3 Degenerative myopia with other maculopathy, bilateral eye: Secondary | ICD-10-CM | POA: Diagnosis not present

## 2024-07-29 ENCOUNTER — Ambulatory Visit: Payer: Self-pay | Admitting: Family

## 2024-07-29 ENCOUNTER — Ambulatory Visit (INDEPENDENT_AMBULATORY_CARE_PROVIDER_SITE_OTHER): Admitting: Family

## 2024-07-29 ENCOUNTER — Encounter: Payer: Self-pay | Admitting: Family

## 2024-07-29 ENCOUNTER — Telehealth: Payer: Self-pay

## 2024-07-29 ENCOUNTER — Ambulatory Visit (INDEPENDENT_AMBULATORY_CARE_PROVIDER_SITE_OTHER)
Admission: RE | Admit: 2024-07-29 | Discharge: 2024-07-29 | Disposition: A | Discharge status: Home / Self Care | Source: Ambulatory Visit | Attending: Family | Admitting: Family

## 2024-07-29 VITALS — BP 144/84 | HR 95 | Temp 98.4°F | Ht 65.0 in | Wt 198.0 lb

## 2024-07-29 DIAGNOSIS — T781XXA Other adverse food reactions, not elsewhere classified, initial encounter: Secondary | ICD-10-CM

## 2024-07-29 DIAGNOSIS — Z862 Personal history of diseases of the blood and blood-forming organs and certain disorders involving the immune mechanism: Secondary | ICD-10-CM

## 2024-07-29 DIAGNOSIS — F1011 Alcohol abuse, in remission: Secondary | ICD-10-CM | POA: Insufficient documentation

## 2024-07-29 DIAGNOSIS — R109 Unspecified abdominal pain: Secondary | ICD-10-CM | POA: Diagnosis not present

## 2024-07-29 DIAGNOSIS — H353131 Nonexudative age-related macular degeneration, bilateral, early dry stage: Secondary | ICD-10-CM | POA: Diagnosis not present

## 2024-07-29 DIAGNOSIS — E559 Vitamin D deficiency, unspecified: Secondary | ICD-10-CM

## 2024-07-29 DIAGNOSIS — K5909 Other constipation: Secondary | ICD-10-CM

## 2024-07-29 DIAGNOSIS — R7303 Prediabetes: Secondary | ICD-10-CM

## 2024-07-29 DIAGNOSIS — Z85828 Personal history of other malignant neoplasm of skin: Secondary | ICD-10-CM

## 2024-07-29 DIAGNOSIS — R1012 Left upper quadrant pain: Secondary | ICD-10-CM | POA: Diagnosis not present

## 2024-07-29 DIAGNOSIS — R5383 Other fatigue: Secondary | ICD-10-CM

## 2024-07-29 DIAGNOSIS — E6609 Other obesity due to excess calories: Secondary | ICD-10-CM

## 2024-07-29 DIAGNOSIS — E7800 Pure hypercholesterolemia, unspecified: Secondary | ICD-10-CM

## 2024-07-29 DIAGNOSIS — H409 Unspecified glaucoma: Secondary | ICD-10-CM | POA: Diagnosis not present

## 2024-07-29 DIAGNOSIS — K5900 Constipation, unspecified: Secondary | ICD-10-CM | POA: Diagnosis not present

## 2024-07-29 LAB — CBC
HCT: 37.1 % (ref 36.0–46.0)
Hemoglobin: 12.3 g/dL (ref 12.0–15.0)
MCHC: 33.2 g/dL (ref 30.0–36.0)
MCV: 88.2 fl (ref 78.0–100.0)
Platelets: 309 K/uL (ref 150.0–400.0)
RBC: 4.21 Mil/uL (ref 3.87–5.11)
RDW: 14.3 % (ref 11.5–15.5)
WBC: 7.2 K/uL (ref 4.0–10.5)

## 2024-07-29 LAB — COMPREHENSIVE METABOLIC PANEL WITH GFR
ALT: 14 U/L (ref 0–35)
AST: 15 U/L (ref 0–37)
Albumin: 4.4 g/dL (ref 3.5–5.2)
Alkaline Phosphatase: 69 U/L (ref 39–117)
BUN: 17 mg/dL (ref 6–23)
CO2: 28 meq/L (ref 19–32)
Calcium: 9.9 mg/dL (ref 8.4–10.5)
Chloride: 95 meq/L — ABNORMAL LOW (ref 96–112)
Creatinine, Ser: 0.48 mg/dL (ref 0.40–1.20)
GFR: 94.5 mL/min (ref >60.00)
Glucose, Bld: 88 mg/dL (ref 70–99)
Potassium: 4.3 meq/L (ref 3.5–5.1)
Sodium: 132 meq/L — ABNORMAL LOW (ref 135–145)
Total Bilirubin: 0.5 mg/dL (ref 0.2–1.2)
Total Protein: 6.9 g/dL (ref 6.0–8.3)

## 2024-07-29 LAB — TSH: TSH: 1.21 u[IU]/mL (ref 0.35–5.50)

## 2024-07-29 LAB — LIPID PANEL
Cholesterol: 193 mg/dL (ref 0–200)
HDL: 75.5 mg/dL (ref >39.00)
LDL Cholesterol: 102 mg/dL — ABNORMAL HIGH (ref 0–99)
NonHDL: 117.05
Total CHOL/HDL Ratio: 3
Triglycerides: 73 mg/dL (ref 0.0–149.0)
VLDL: 14.6 mg/dL (ref 0.0–40.0)

## 2024-07-29 LAB — AMYLASE: Amylase: 52 U/L (ref 27–131)

## 2024-07-29 LAB — IBC + FERRITIN
Ferritin: 84.3 ng/mL (ref 10.0–291.0)
Iron: 49 ug/dL (ref 42–145)
Saturation Ratios: 13.8 % — ABNORMAL LOW (ref 20.0–50.0)
TIBC: 355.6 ug/dL (ref 250.0–450.0)
Transferrin: 254 mg/dL (ref 212.0–360.0)

## 2024-07-29 LAB — VITAMIN D 25 HYDROXY (VIT D DEFICIENCY, FRACTURES): VITD: 31.88 ng/mL (ref 30.00–100.00)

## 2024-07-29 LAB — HEMOGLOBIN A1C: Hgb A1c MFr Bld: 6 % (ref 4.6–6.5)

## 2024-07-29 LAB — FERRITIN: Ferritin: 84.3 ng/mL (ref 10.0–291.0)

## 2024-07-29 LAB — LIPASE: Lipase: 30 U/L (ref 11.0–59.0)

## 2024-07-29 LAB — VITAMIN B12: Vitamin B-12: 257 pg/mL (ref 211–911)

## 2024-07-29 MED ORDER — LINACLOTIDE 72 MCG PO CAPS: 72.0000 ug | ORAL_CAPSULE | Freq: Every day | ORAL | 0 refills | Status: DC

## 2024-07-29 MED ORDER — POLYETHYLENE GLYCOL 3350 17 GM/SCOOP PO POWD: 17.0000 g | Freq: Every day | ORAL | Status: DC | PRN

## 2024-07-29 MED ORDER — LINACLOTIDE 72 MCG PO CAPS
72.0000 ug | ORAL_CAPSULE | Freq: Every day | ORAL | 0 refills | Status: DC
Start: 2024-07-29 — End: 2024-07-29

## 2024-07-29 NOTE — Telephone Encounter (Signed)
 Spoke with pt and made her aware that we do not have savings card but she can go online and print a savings card off. Nothing further was needed.
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
## 2024-07-29 NOTE — Telephone Encounter (Signed)
 Copied from CRM #[id number]. Topic: Clinical - Prescription Issue >> Jul 29, 2024 12:38 PM Zebedee SAUNDERS wrote: Reason for CRM:Pt was told by pharmacy that PCP may have coupon for linaclotide  (LINZESS ) 72 MCG capsule since it is expensive for pt. Pt would like a call back to [phone].

## 2024-07-29 NOTE — Patient Instructions (Addendum)
   Call gastroenterology to schedule a colonoscopy   Terri CHARLENA Commander, MD 5.0(1)  Gastroenterologist [address] [place]  [phone]   VISIT SUMMARY: Today, you came in for a transfer of care appointment. We discussed your chronic constipation, macular degeneration, glaucoma, prediabetes, hyperlipidemia, osteoarthritis of the feet, history of skin cancer, and history of alcoholism. You were accompanied by your daughter.  YOUR PLAN: -CHRONIC CONSTIPATION: Chronic constipation means having infrequent or difficult bowel movements for an extended period. To address this, we will schedule a colonoscopy with Dr. Lupita Crawford on September 5th, refer you to a gastroenterologist, and order an abdominal x-ray to check for any blockages. We will also check your blood for B12, electrolytes, kidney function, and thyroid  function. Please continue taking Miralax  daily, follow the low FODMAP diet handout provided, and stay hydrated.  -MACULAR DEGENERATION AND GLAUCOMA, BILATERAL: Macular degeneration and glaucoma are eye conditions that can lead to vision loss. You will continue with your regular eye injections every four weeks and follow-ups with your glaucoma specialist every six months.  -PREDIABETES: Prediabetes means your blood sugar levels are higher than normal but not high enough to be classified as diabetes. We will monitor your A1c levels regularly. Please continue with your low sugar and low carbohydrate diet and keep exercising at the [facility] three times a week.  -HYPERLIPIDEMIA: Hyperlipidemia means having high levels of fats in your blood. You are managing this condition with atorvastatin  and have reported no side effects.  -OSTEOARTHRITIS OF FEET: Osteoarthritis is a condition that causes the joints to become painful and stiff. We noted bony changes in your feet during the examination.  -HISTORY OF SKIN CANCER, STATUS POST REMOVAL: You have a history of skin cancer, and a lesion was  recently removed in May. You will continue with regular follow-ups with your dermatologist.  -HISTORY OF ALCOHOLISM, IN REMISSION: You have a history of alcoholism but have been sober for over three years. You are no longer taking naltrexone.  INSTRUCTIONS: Please follow up with Dr. Lupita Crawford for your colonoscopy on September 5th. Additionally, schedule an appointment with a gastroenterologist for further evaluation of your constipation. Continue with your regular eye injections and follow-ups with your glaucoma specialist. Monitor your A1c levels regularly and maintain your diet and exercise routine. Keep up with your dermatology follow-ups for skin cancer monitoring.

## 2024-07-29 NOTE — Progress Notes (Deleted)
 Established Patient Office Visit  Subjective:  Patient ID: Terri Crawford, female    DOB: 08-04-1951  Age: 73 y.o. MRN: [id number]  CC:  Chief Complaint  Patient presents with   Establish Care    TOC from Angeline Laura, NP    HPI Terri Crawford is here for a transition of care visit.      chronic concerns:  Lab Results  Component Value Date   HGBA1C 5.8 (H) 08/20/2023       Past Medical History:  Diagnosis Date   Allergy    Basal cell carcinoma    Chicken pox    GERD (gastroesophageal reflux disease)    Glaucoma    Heart murmur    History of blood transfusion    History of stomach ulcers    Hx of adenomatous polyp of colon 09/05/2017   Post-operative nausea and vomiting    slow to wake up    Past Surgical History:  Procedure Laterality Date   CATARACT EXTRACTION, BILATERAL     CESAREAN SECTION     MANDIBLE FRACTURE SURGERY  1973   TUBAL LIGATION      Family History  Problem Relation Age of Onset   Arthritis Mother    Uterine cancer Mother    Diabetes Mother    Heart disease Father    Cancer Maternal Uncle    Throat cancer Maternal Grandmother    Diabetes Maternal Grandmother    Prostate cancer Maternal Grandfather    Colon cancer Neg Hx     Social History   Socioeconomic History   Marital status: Divorced    Spouse name: Not on file   Number of children: 1   Years of education: college   Highest education level: Some college, no degree  Occupational History   Occupation: Network engineer   Occupation: Optometrist  Tobacco Use   Smoking status: Former    Types: Cigarettes   Smokeless tobacco: Never  Vaping Use   Vaping status: Never Used  Substance and Sexual Activity   Alcohol use: No   Drug use: No   Sexual activity: Not Currently    Partners: Male  Other Topics Concern   Not on file  Social History Narrative   Daughter alison    Living with herself    Does not drive due to eye sight    Social Drivers of Health    Financial Resource Strain: Low Risk  (07/25/2024)   Overall Financial Resource Strain (CARDIA)    Difficulty of Paying Living Expenses: Not very hard  Food Insecurity: No Food Insecurity (07/25/2024)   Hunger Vital Sign    Worried About Running Out of Food in the Last Year: Never true    Ran Out of Food in the Last Year: Never true  Transportation Needs: No Transportation Needs (07/25/2024)   PRAPARE - Administrator, Civil Service (Medical): No    Lack of Transportation (Non-Medical): No  Physical Activity: Insufficiently Active (07/25/2024)   Exercise Vital Sign    Days of Exercise per Week: 3 days    Minutes of Exercise per Session: 40 min  Stress: No Stress Concern Present (07/25/2024)   [employer] of Occupational Health - Occupational Stress Questionnaire    Feeling of Stress: Only a little  Social Connections: Moderately Integrated (07/25/2024)   Social Connection and Isolation Panel    Frequency of Communication with Friends and Family: More than three times a week    Frequency of Social  Gatherings with Friends and Family: More than three times a week    Attends Religious Services: More than 4 times per year    Active Member of Clubs or Organizations: Yes    Attends Engineer, structural: More than 4 times per year    Marital Status: Divorced  Intimate Partner Violence: Not At Risk (05/16/2023)   Humiliation, Afraid, Rape, and Kick questionnaire    Fear of Current or Ex-Partner: No    Emotionally Abused: No    Physically Abused: No    Sexually Abused: No    Outpatient Medications Prior to Visit  Medication Sig Dispense Refill   atorvastatin  (LIPITOR) 20 MG tablet TAKE 1 TABLET BY MOUTH EVERY DAY 90 tablet 0   latanoprost (XALATAN) 0.005 % ophthalmic solution 1 drop at bedtime.     Multiple Vitamins-Minerals (VISION FORMULA EYE HEALTH) CAPS Take 2 capsules by mouth daily.     Povidone, PF, (IVIZIA DRY EYES) 0.5 % SOLN Apply to eye.     timolol (TIMOPTIC)  0.5 % ophthalmic solution SMARTSIG:In Eye(s)     cetirizine (ZYRTEC) 10 MG tablet Take 10 mg by mouth daily.     omeprazole  (PRILOSEC) 20 MG capsule TAKE 1 CAPSULE BY MOUTH EVERY DAY (Patient not taking: Reported on 05/16/2023) 90 capsule 0   No facility-administered medications prior to visit.    Allergies  Allergen Reactions   Phenobarbital Other (See Comments)    syncope   Codeine Nausea And Vomiting    ROS Review of Systems  Review of Systems  Respiratory:  Negative for shortness of breath.   Cardiovascular:  Negative for chest pain and palpitations.  Gastrointestinal:  Negative for constipation and diarrhea.  Genitourinary:  Negative for dysuria, frequency and urgency.  Musculoskeletal:  Negative for myalgias.  Psychiatric/Behavioral:  Negative for depression and suicidal ideas.   All other systems reviewed and are negative.    Objective:    Physical Exam  Gen: NAD, resting comfortably CV: RRR with no murmurs appreciated Pulm: NWOB, CTAB with no crackles, wheezes, or rhonchi Skin: warm, dry Psych: Normal affect and thought content  BP (!) 144/84 (BP Location: Left Arm, Patient Position: Sitting, Cuff Size: Large)   Pulse 95   Temp 98.4 F (36.9 C) (Temporal)   Ht 5' 5 (1.651 m)   Wt 198 lb (89.8 kg)   SpO2 98%   BMI 32.95 kg/m  Wt Readings from Last 3 Encounters:  07/29/24 198 lb (89.8 kg)  08/20/23 197 lb (89.4 kg)  05/16/23 190 lb 3.2 oz (86.3 kg)     Health Maintenance Due  Topic Date Due   MAMMOGRAM  Never done   DEXA SCAN  Never done   Zoster Vaccines- Shingrix (2 of 2) 12/11/2020   Medicare Annual Wellness (AWV)  05/15/2024   Colonoscopy  08/27/2024    There are no preventive care reminders to display for this patient.  Lab Results  Component Value Date   TSH 1.83 07/29/2019   Lab Results  Component Value Date   WBC 8.6 08/20/2023   HGB 12.0 08/20/2023   HCT 37.0 08/20/2023   MCV 91.8 08/20/2023   PLT 310 08/20/2023   Lab Results   Component Value Date   NA 136 08/20/2023   K 4.5 08/20/2023   CO2 28 08/20/2023   GLUCOSE 87 08/20/2023   BUN 18 08/20/2023   CREATININE 0.50 (L) 08/20/2023   BILITOT 0.4 08/20/2023   ALKPHOS 74 02/25/2020   AST 13 08/20/2023  ALT 11 08/20/2023   PROT 7.0 08/20/2023   ALBUMIN 4.1 02/25/2020   CALCIUM  9.9 08/20/2023   EGFR 100 08/20/2023   GFR 107.60 02/25/2020   Lab Results  Component Value Date   CHOL 202 (H) 08/20/2023   Lab Results  Component Value Date   HDL 76 08/20/2023   Lab Results  Component Value Date   LDLCALC 105 (H) 08/20/2023   Lab Results  Component Value Date   TRIG 115 08/20/2023   Lab Results  Component Value Date   CHOLHDL 2.7 08/20/2023   Lab Results  Component Value Date   HGBA1C 5.8 (H) 08/20/2023      Assessment & Plan:   Early dry stage nonexudative age-related macular degeneration of both eyes  Glaucoma of both eyes, unspecified glaucoma type  Pure hypercholesterolemia -     Lipid panel  Class 1 obesity due to excess calories with serious comorbidity and body mass index (BMI) of 33.0 to 33.9 in adult  Prediabetes -     Comprehensive metabolic panel with GFR -     Hemoglobin A1c  Chronic constipation -     Polyethylene Glycol 3350 ; Take 17 g by mouth daily as needed. -     DG Abd 1 View; Future -     Ambulatory referral to Gastroenterology  History of skin cancer  History of alcohol abuse  Other fatigue -     Vitamin B12 -     TSH -     CBC  History of anemia -     CBC -     IBC + Ferritin -     Ferritin  Vitamin D  deficiency -     VITAMIN D  25 Hydroxy (Vit-D Deficiency, Fractures)  Left upper quadrant abdominal pain -     Amylase -     Lipase    Meds ordered this encounter  Medications   polyethylene glycol powder (GLYCOLAX /MIRALAX ) 17 GM/SCOOP powder    Sig: Take 17 g by mouth daily as needed.    Supervising Provider:   AVELINA, AMY E [[id number]]   DISCONTD: linaclotide  (LINZESS ) 72 MCG capsule    Sig:  Take 1 capsule (72 mcg total) by mouth daily before breakfast.    Dispense:  90 capsule    Refill:  0    Supervising Provider:   AVELINA, AMY E [[id number]]    Follow-up: Return in about 1 month (around 08/29/2024) for f/u abdominal issues .    Ginger Patrick, FNP

## 2024-07-30 LAB — CELIAC DISEASE PANEL
(tTG) Ab, IgA: <1 U/mL
(tTG) Ab, IgG: <1 U/mL
Gliadin IgA: <1 U/mL
Gliadin IgG: <1 U/mL
Immunoglobulin A: 319 mg/dL (ref 70–320)

## 2024-08-04 DIAGNOSIS — H353134 Nonexudative age-related macular degeneration, bilateral, advanced atrophic with subfoveal involvement: Secondary | ICD-10-CM | POA: Diagnosis not present

## 2024-08-04 DIAGNOSIS — H401122 Primary open-angle glaucoma, left eye, moderate stage: Secondary | ICD-10-CM | POA: Diagnosis not present

## 2024-08-05 ENCOUNTER — Encounter: Payer: Self-pay | Admitting: Gastroenterology

## 2024-08-24 ENCOUNTER — Encounter: Payer: Self-pay | Admitting: Family

## 2024-08-24 ENCOUNTER — Ambulatory Visit (INDEPENDENT_AMBULATORY_CARE_PROVIDER_SITE_OTHER): Admitting: Family

## 2024-08-24 VITALS — BP 138/80 | HR 86 | Temp 98.1°F | Ht 65.0 in | Wt 201.2 lb

## 2024-08-24 DIAGNOSIS — R1030 Lower abdominal pain, unspecified: Secondary | ICD-10-CM

## 2024-08-24 DIAGNOSIS — R1012 Left upper quadrant pain: Secondary | ICD-10-CM | POA: Diagnosis not present

## 2024-08-24 DIAGNOSIS — K5909 Other constipation: Secondary | ICD-10-CM | POA: Diagnosis not present

## 2024-08-24 DIAGNOSIS — E538 Deficiency of other specified B group vitamins: Secondary | ICD-10-CM | POA: Diagnosis not present

## 2024-08-24 DIAGNOSIS — R5383 Other fatigue: Secondary | ICD-10-CM

## 2024-08-24 MED ORDER — LINACLOTIDE 145 MCG PO CAPS: 145.0000 ug | ORAL_CAPSULE | Freq: Every day | ORAL | 2 refills | Status: DC

## 2024-08-24 NOTE — Progress Notes (Signed)
 Established Patient Office Visit  Subjective:      CC:  Chief Complaint  Patient presents with   Follow-up    HPI: Terri Crawford is a 73 y.o. female presenting on 08/24/2024 for Follow-up .  Discussed the use of AI scribe software for clinical note transcription with the patient, who gave verbal consent to proceed.  History of Present Illness Terri Crawford is a 73 year old female who presents with gastrointestinal symptoms. She is accompanied by her daughter, Isaiah.  She has been experiencing chronic constipation and gastrointestinal discomfort. She was previously on Miralax  but has switched to Linzess , starting at a low dose of 72 micrograms. She has noted some improvement with more frequent bowel movements, but still experiences gas and occasional constipation. An abdominal x-ray on August 7th showed a significant amount of stool but no obstruction.  She experiences gas and bloating, which sometimes wakes her up early in the morning. Her daughter notes that her gastrointestinal issues have impacted her ability to engage in daily activities and social events. No heartburn, and she does not take medication for it. She has not been told she snores and does not have morning headaches. The pain sometimes wakes her up at night, but she does not experience it constantly throughout the day.  She has been experiencing fatigue and has started taking vitamin D3, B12, and slow-release iron supplements. She takes the iron every other day to avoid exacerbating constipation. Her B12 level was previously 257, and she is taking 500 to 1000 mcg of B12. She is also taking 2000 IU of vitamin D .  She has a history of diverticulosis but denies any recent worsening of symptoms. No fever and her white blood cell count was normal in the past. Her abdominal pain has been present for months but has not worsened recently.         Social history:  Relevant past medical, surgical, family and  social history reviewed and updated as indicated. Interim medical history since our last visit reviewed.  Allergies and medications reviewed and updated.  DATA REVIEWED: CHART IN EPIC     ROS: Negative unless specifically indicated above in HPI.    Current Outpatient Medications:    atorvastatin  (LIPITOR) 20 MG tablet, TAKE 1 TABLET BY MOUTH EVERY DAY, Disp: 90 tablet, Rfl: 0   Ferrous Sulfate (SLOW FE PO), Take 1 each by mouth every other day., Disp: , Rfl:    latanoprost (XALATAN) 0.005 % ophthalmic solution, 1 drop at bedtime., Disp: , Rfl:    Multiple Vitamins-Minerals (VISION FORMULA EYE HEALTH) CAPS, Take 2 capsules by mouth daily., Disp: , Rfl:    polyethylene glycol powder (GLYCOLAX /MIRALAX ) 17 GM/SCOOP powder, Take 17 g by mouth daily as needed., Disp: , Rfl:    Povidone, PF, (IVIZIA DRY EYES) 0.5 % SOLN, Apply to eye., Disp: , Rfl:    timolol (TIMOPTIC) 0.5 % ophthalmic solution, SMARTSIG:In Eye(s), Disp: , Rfl:    linaclotide  (LINZESS ) 145 MCG CAPS capsule, Take 1 capsule (145 mcg total) by mouth daily before breakfast., Disp: 30 capsule, Rfl: 2        Objective:        BP 138/80 (BP Location: Left Arm, Patient Position: Sitting, Cuff Size: Large)   Pulse 86   Temp 98.1 F (36.7 C) (Temporal)   Ht 5' 5 (1.651 m)   Wt 201 lb 3.2 oz (91.3 kg)   SpO2 98%   BMI 33.48 kg/m   Physical Exam ABDOMEN: Tenderness  on palpation, more pronounced on the right side, rated 6/10.  Wt Readings from Last 3 Encounters:  08/24/24 201 lb 3.2 oz (91.3 kg)  07/29/24 198 lb (89.8 kg)  08/20/23 197 lb (89.4 kg)    Physical Exam Constitutional:      General: She is not in acute distress.    Appearance: Normal appearance. She is normal weight. She is not ill-appearing, toxic-appearing or diaphoretic.  HENT:     Head: Normocephalic.  Cardiovascular:     Rate and Rhythm: Normal rate.  Pulmonary:     Effort: Pulmonary effort is normal.  Abdominal:     General: Bowel sounds  are normal. There is no distension.     Tenderness: There is abdominal tenderness. There is no guarding or rebound.     Hernia: No hernia is present.  Musculoskeletal:        General: Normal range of motion.  Neurological:     General: No focal deficit present.     Mental Status: She is alert and oriented to person, place, and time. Mental status is at baseline.  Psychiatric:        Mood and Affect: Mood normal.        Behavior: Behavior normal.        Thought Content: Thought content normal.        Judgment: Judgment normal.          Results LABS Vitamin B12: 257 pg/mL  RADIOLOGY Abdominal X-ray (07/29/2024): Significant fecal retention, no obstruction  Assessment & Plan:   Assessment and Plan Assessment & Plan Chronic constipation Chronic constipation with partial improvement on Linzess  72 mcg. Abdominal x-ray showed significant stool burden without obstruction. Current dose is insufficient, with bowel movements every other day and persistent gas. -continue 72 mcg capsules daily.may consider increase if neg CT  - Continue with scheduled GI consultation and colonoscopy.  Fatigue Persistent fatigue potentially related to vitamin deficiencies and possible sleep disturbances. Recently started on vitamin D , B12, and iron supplements. Improvement expected with correction of deficiencies. - Continue vitamin D , B12, and iron supplementation. - Reassess fatigue in one month. - Consider sleep study if fatigue persists.  Vitamin B12 deficiency Vitamin B12 deficiency with recent initiation of supplementation. Current level at 257. Supplementation expected to improve fatigue and energy levels. - Continue B12 supplementation at [phone] mcg daily. - Recheck B12 levels in one month.  Acute abdominal pain Intermittent abdominal pain with tenderness, particularly on the right side, present for months. Differential includes chronic constipation and possible diverticulitis, though less  likely due to chronicity and lack of acute symptoms. - Order CT scan of the abdomen with fasting instructions. - Follow up on CT scan results.  Iron deficiency Iron deficiency with recent initiation of slow-release iron supplementation. Monitoring for any exacerbation of constipation symptoms due to supplementation. - Continue slow-release iron supplementation every other day.  Vitamin d  deficiency Vitamin D  deficiency with recent initiation of supplementation. Supplementation expected to improve fatigue and overall health. - Continue vitamin D  supplementation at 2000 IU daily. - Recheck vitamin D  levels in one month.  Recording duration: 17 minutes      Return in about 3 months (around 11/23/2024) for f/u constipation .     Ginger Patrick, MSN, APRN, FNP-C [facility]

## 2024-08-25 ENCOUNTER — Ambulatory Visit
Admission: RE | Admit: 2024-08-25 | Discharge: 2024-08-25 | Disposition: A | Discharge status: Home / Self Care | Source: Ambulatory Visit | Attending: Family | Admitting: Family

## 2024-08-25 ENCOUNTER — Ambulatory Visit: Payer: Self-pay | Admitting: Family

## 2024-08-25 DIAGNOSIS — K429 Umbilical hernia without obstruction or gangrene: Secondary | ICD-10-CM | POA: Diagnosis not present

## 2024-08-25 DIAGNOSIS — R1012 Left upper quadrant pain: Secondary | ICD-10-CM | POA: Diagnosis not present

## 2024-08-25 DIAGNOSIS — K5909 Other constipation: Secondary | ICD-10-CM

## 2024-08-25 DIAGNOSIS — R1030 Lower abdominal pain, unspecified: Secondary | ICD-10-CM | POA: Diagnosis not present

## 2024-08-25 DIAGNOSIS — K2100 Gastro-esophageal reflux disease with esophagitis, without bleeding: Secondary | ICD-10-CM

## 2024-08-25 DIAGNOSIS — K449 Diaphragmatic hernia without obstruction or gangrene: Secondary | ICD-10-CM | POA: Diagnosis not present

## 2024-08-25 DIAGNOSIS — K5730 Diverticulosis of large intestine without perforation or abscess without bleeding: Secondary | ICD-10-CM | POA: Diagnosis not present

## 2024-08-25 DIAGNOSIS — R1010 Upper abdominal pain, unspecified: Secondary | ICD-10-CM

## 2024-08-25 DIAGNOSIS — R1013 Epigastric pain: Secondary | ICD-10-CM | POA: Diagnosis not present

## 2024-08-25 MED ORDER — IOHEXOL 300 MG/ML  SOLN
100.0000 mL | Freq: Once | INTRAMUSCULAR | Status: AC | PRN
  Administered 2024-08-25: 100 mL via INTRAVENOUS

## 2024-08-31 ENCOUNTER — Other Ambulatory Visit: Payer: Self-pay | Admitting: Internal Medicine

## 2024-09-01 NOTE — Telephone Encounter (Signed)
 No longer under prescriber care.  Requested Prescriptions  Pending Prescriptions Disp Refills   atorvastatin  (LIPITOR) 20 MG tablet [Pharmacy Med Name: ATORVASTATIN  20 MG TABLET] 90 tablet 0    Sig: TAKE 1 TABLET BY MOUTH EVERY DAY     Cardiovascular:  Antilipid - Statins Failed - 09/01/2024  8:24 AM      Failed - Valid encounter within last 12 months    Recent Outpatient Visits   None            Failed - Lipid Panel in normal range within the last 12 months    Cholesterol  Date Value Ref Range Status  07/29/2024 193 0 - 200 mg/dL Final    Comment:    ATP III Classification       Desirable:  < 200 mg/dL               Borderline High:  200 - 239 mg/dL          High:  > = 759 mg/dL   LDL Cholesterol (Calc)  Date Value Ref Range Status  08/20/2023 105 (H) mg/dL (calc) Final    Comment:    Reference range: <100 . Desirable range <100 mg/dL for primary prevention;   <70 mg/dL for patients with CHD or diabetic patients  with > or = 2 CHD risk factors. SABRA LDL-C is now calculated using the Martin-Hopkins  calculation, which is a validated novel method providing  better accuracy than the Friedewald equation in the  estimation of LDL-C.  Gladis APPLETHWAITE et al. SANDREA. 7986;689(80): 2061-2068  (http://education.[employer].com/faq/FAQ164)    LDL Cholesterol  Date Value Ref Range Status  07/29/2024 102 (H) 0 - 99 mg/dL Final   HDL  Date Value Ref Range Status  07/29/2024 75.50 >39.00 mg/dL Final   Triglycerides  Date Value Ref Range Status  07/29/2024 73.0 0.0 - 149.0 mg/dL Final    Comment:    Normal:  <150 mg/dLBorderline High:  150 - 199 mg/dL         Passed - Patient is not pregnant

## 2024-09-07 ENCOUNTER — Encounter: Payer: Self-pay | Admitting: Internal Medicine

## 2024-09-15 DIAGNOSIS — H353223 Exudative age-related macular degeneration, left eye, with inactive scar: Secondary | ICD-10-CM | POA: Diagnosis not present

## 2024-09-15 DIAGNOSIS — H353211 Exudative age-related macular degeneration, right eye, with active choroidal neovascularization: Secondary | ICD-10-CM | POA: Diagnosis not present

## 2024-09-15 DIAGNOSIS — H35373 Puckering of macula, bilateral: Secondary | ICD-10-CM | POA: Diagnosis not present

## 2024-09-15 DIAGNOSIS — H43813 Vitreous degeneration, bilateral: Secondary | ICD-10-CM | POA: Diagnosis not present

## 2024-09-15 DIAGNOSIS — H442E3 Degenerative myopia with other maculopathy, bilateral eye: Secondary | ICD-10-CM | POA: Diagnosis not present

## 2024-09-23 DIAGNOSIS — D485 Neoplasm of uncertain behavior of skin: Secondary | ICD-10-CM | POA: Diagnosis not present

## 2024-09-23 DIAGNOSIS — D225 Melanocytic nevi of trunk: Secondary | ICD-10-CM | POA: Diagnosis not present

## 2024-09-23 DIAGNOSIS — D2262 Melanocytic nevi of left upper limb, including shoulder: Secondary | ICD-10-CM | POA: Diagnosis not present

## 2024-09-23 DIAGNOSIS — D2272 Melanocytic nevi of left lower limb, including hip: Secondary | ICD-10-CM | POA: Diagnosis not present

## 2024-09-23 DIAGNOSIS — C44311 Basal cell carcinoma of skin of nose: Secondary | ICD-10-CM | POA: Diagnosis not present

## 2024-09-23 DIAGNOSIS — Z85828 Personal history of other malignant neoplasm of skin: Secondary | ICD-10-CM | POA: Diagnosis not present

## 2024-09-23 DIAGNOSIS — L570 Actinic keratosis: Secondary | ICD-10-CM | POA: Diagnosis not present

## 2024-09-23 DIAGNOSIS — D2261 Melanocytic nevi of right upper limb, including shoulder: Secondary | ICD-10-CM | POA: Diagnosis not present

## 2024-09-28 NOTE — Progress Notes (Unsigned)
 ERCILIA BETTINGER [id number] [birth year]   Chief Complaint:  Referring Provider: Corwin Antu, FNP Primary GI MD: Dr. Avram  HPI: Terri Crawford is a 73 y.o. female with past medical history of *** who presents today for a complaint of chronic constipation and to discuss colonoscopy.    Has not been seen in office since 2019 at which time she was evaluated by Greig Corti, PA-C for 25-month history of ongoing lower abdominal discomfort, cramping, bloating, gas, and change in bowel habits with alternating diarrhea and constipation.  Pain significant at times to the point of causing nausea and waking her up from sleep.  She was up-to-date on colonoscopy which at that time had last been done in September 2018.  Was advised to start Benefiber and low gas diet, and CT of the abdomen and pelvis was ordered.  CT showed no acute findings, diverticulosis without evidence of diverticulitis, and a small umbilical hernia.  Did not have repeat colonoscopy in 2023.  Seen by PCP 07/29/2024 at which time she endorsed worsening constipation in the last few years with gas, pain severe enough to affect sleep and daily activities.  Referred to GI for further evaluation of constipation and for colonoscopy.  Abdominal x-ray was ordered, low FODMAP diet given, advised daily MiraLAX  and encouraged hydration.  Labs 07/29/2024: Normal CBC, unremarkable CMP, mild increase in LDL cholesterol, normal TSH, low normal vitamin B12 257, low normal iron, normal ferritin, low saturation ratio 13.8, normal vitamin D , negative celiac panel, normal amylase and lipase  Abdominal x-ray showed mildly prominent stool throughout the colon consistent with constipation, no evidence of bowel obstruction.  CT A/P 08/25/2024 showed a small sliding-type hiatal hernia, sigmoid diverticulosis, and a small fat-containing periumbilical hernia   Previous GI Procedures/Imaging   Colonoscopy 08/27/2017 - One 5 mm polyp in the transverse  colon, removed with a cold snare. Resected and retrieved.  - Diverticulosis in the sigmoid colon.  - The examination was otherwise normal on direct and retroflexion views. - Recall 5 years Path: Surgical [P], transverse, polyp - SESSILE SERRATED POLYP WITHOUT CYTOLOGIC DYSPLASIA.   Past Medical History:  Diagnosis Date   Allergy    Basal cell carcinoma    Chicken pox    GERD (gastroesophageal reflux disease)    Glaucoma    Heart murmur    History of blood transfusion    History of stomach ulcers    Hx of adenomatous polyp of colon 09/05/2017   Post-operative nausea and vomiting    slow to wake up    Past Surgical History:  Procedure Laterality Date   CATARACT EXTRACTION, BILATERAL     CESAREAN SECTION     MANDIBLE FRACTURE SURGERY  1973   TUBAL LIGATION      Current Outpatient Medications  Medication Sig Dispense Refill   atorvastatin  (LIPITOR) 20 MG tablet TAKE 1 TABLET BY MOUTH EVERY DAY 90 tablet 0   Ferrous Sulfate (SLOW FE PO) Take 1 each by mouth every other day.     latanoprost (XALATAN) 0.005 % ophthalmic solution 1 drop at bedtime.     linaclotide  (LINZESS ) 145 MCG CAPS capsule Take 1 capsule (145 mcg total) by mouth daily before breakfast. 30 capsule 2   Multiple Vitamins-Minerals (VISION FORMULA EYE HEALTH) CAPS Take 2 capsules by mouth daily.     polyethylene glycol powder (GLYCOLAX /MIRALAX ) 17 GM/SCOOP powder Take 17 g by mouth daily as needed.     Povidone, PF, (IVIZIA DRY EYES) 0.5 % SOLN Apply to  eye.     timolol (TIMOPTIC) 0.5 % ophthalmic solution SMARTSIG:In Eye(s)     No current facility-administered medications for this visit.    Allergies as of 09/29/2024 - Review Complete 08/25/2024  Allergen Reaction Noted   Phenobarbital Other (See Comments) 09/10/2016   Codeine Nausea And Vomiting 09/10/2016    Family History  Problem Relation Age of Onset   Arthritis Mother    Uterine cancer Mother    Diabetes Mother    Heart disease Father     Cancer Maternal Uncle    Throat cancer Maternal Grandmother    Diabetes Maternal Grandmother    Prostate cancer Maternal Grandfather    Colon cancer Neg Hx     Social History   Tobacco Use   Smoking status: Former    Types: Cigarettes   Smokeless tobacco: Never  Vaping Use   Vaping status: Never Used  Substance Use Topics   Alcohol use: No   Drug use: No     Review of Systems:    Constitutional: No weight loss, fever, chills, weakness or fatigue Eyes: No change in vision Ears, Nose, Throat:  No change in hearing or congestion Skin: No rash or itching Cardiovascular: No chest pain, chest pressure or palpitations   Respiratory: No SOB or cough Gastrointestinal: See HPI and otherwise negative Genitourinary: No dysuria or change in urinary frequency Neurological: No headache, dizziness or syncope Musculoskeletal: No new muscle or joint pain Hematologic: No bleeding or bruising    Physical Exam:  Vital signs: There were no vitals taken for this visit.  Constitutional: NAD, Well developed, Well nourished, alert and cooperative Head:  Normocephalic and atraumatic.  Eyes: No scleral icterus. Conjunctiva pink. Mouth: No oral lesions. Respiratory: Respirations even and unlabored. Lungs clear to auscultation bilaterally.  No wheezes, crackles, or rhonchi.  Cardiovascular:  Regular rate and rhythm. No murmurs. No peripheral edema. Gastrointestinal:  Soft, nondistended, nontender. No rebound or guarding. Normal bowel sounds. No appreciable masses or hepatomegaly. Rectal:  Not performed.  Neurologic:  Alert and oriented x4;  grossly normal neurologically.  Skin:   Dry and intact without significant lesions or rashes. Psychiatric: Oriented to person, place and time. Demonstrates good judgement and reason without abnormal affect or behaviors.   RELEVANT LABS AND IMAGING: CBC    Component Value Date/Time   WBC 7.2 07/29/2024 0951   RBC 4.21 07/29/2024 0951   HGB 12.3  07/29/2024 0951   HCT 37.1 07/29/2024 0951   PLT 309.0 07/29/2024 0951   MCV 88.2 07/29/2024 0951   MCH 29.8 08/20/2023 0953   MCHC 33.2 07/29/2024 0951   RDW 14.3 07/29/2024 0951   LYMPHSABS 1,380 08/16/2021 1354   EOSABS 117 08/16/2021 1354   BASOSABS 37 08/16/2021 1354    CMP     Component Value Date/Time   NA 132 (L) 07/29/2024 0951   K 4.3 07/29/2024 0951   CL 95 (L) 07/29/2024 0951   CO2 28 07/29/2024 0951   GLUCOSE 88 07/29/2024 0951   BUN 17 07/29/2024 0951   CREATININE 0.48 07/29/2024 0951   CREATININE 0.50 (L) 08/20/2023 0953   CALCIUM  9.9 07/29/2024 0951   PROT 6.9 07/29/2024 0951   ALBUMIN 4.4 07/29/2024 0951   AST 15 07/29/2024 0951   ALT 14 07/29/2024 0951   ALKPHOS 69 07/29/2024 0951   BILITOT 0.5 07/29/2024 0951     Assessment/Plan:    Schedule EGD and colonoscopy based on low iron saturation ratio   Camie Furbish, PA-C [facility] 09/28/2024,  7:07 PM  Patient Care Team: Corwin Antu, FNP as PCP - General (Family Medicine)

## 2024-09-29 ENCOUNTER — Encounter: Payer: Self-pay | Admitting: Gastroenterology

## 2024-09-29 ENCOUNTER — Ambulatory Visit: Admitting: Gastroenterology

## 2024-09-29 VITALS — BP 134/84 | HR 87 | Ht 65.0 in | Wt 198.0 lb

## 2024-09-29 DIAGNOSIS — R790 Abnormal level of blood mineral: Secondary | ICD-10-CM

## 2024-09-29 DIAGNOSIS — Z8719 Personal history of other diseases of the digestive system: Secondary | ICD-10-CM | POA: Diagnosis not present

## 2024-09-29 DIAGNOSIS — Z860101 Personal history of adenomatous and serrated colon polyps: Secondary | ICD-10-CM

## 2024-09-29 DIAGNOSIS — K449 Diaphragmatic hernia without obstruction or gangrene: Secondary | ICD-10-CM

## 2024-09-29 DIAGNOSIS — R1030 Lower abdominal pain, unspecified: Secondary | ICD-10-CM | POA: Diagnosis not present

## 2024-09-29 DIAGNOSIS — K5900 Constipation, unspecified: Secondary | ICD-10-CM | POA: Diagnosis not present

## 2024-09-29 DIAGNOSIS — Z8711 Personal history of peptic ulcer disease: Secondary | ICD-10-CM | POA: Diagnosis not present

## 2024-09-29 MED ORDER — LINACLOTIDE 290 MCG PO CAPS: 290.0000 ug | ORAL_CAPSULE | Freq: Every day | ORAL | 0 refills | Status: AC

## 2024-09-29 MED ORDER — NA SULFATE-K SULFATE-MG SULF 17.5-3.13-1.6 GM/177ML PO SOLN: 1.0000 | ORAL | 0 refills | Status: DC

## 2024-09-29 NOTE — Patient Instructions (Addendum)
 We have sent the following medications to your pharmacy for you to pick up at your convenience: Suprep , Linzess  ( )     Please purchase the following medications over the counter and take as directed: Ibgard - use as directed   See fodmap handout    _______________________________________________________  If your blood pressure at your visit was 140/90 or greater, please contact your primary care physician to follow up on this.  _______________________________________________________  If you are age 73 or older, your body mass index should be between 23-30. Your Body mass index is 32.95 kg/m. If this is out of the aforementioned range listed, please consider follow up with your Primary Care Provider.  If you are age 92 or younger, your body mass index should be between 19-25. Your Body mass index is 32.95 kg/m. If this is out of the aformentioned range listed, please consider follow up with your Primary Care Provider.   ________________________________________________________  The [facility] providers would like to encourage you to use MYCHART to communicate with providers for non-urgent requests or questions.  Due to long hold times on the telephone, sending your provider a message by [facility] may be a faster and more efficient way to get a response.  Please allow 48 business hours for a response.  Please remember that this is for non-urgent requests.  _______________________________________________________  [facility] is using a team-based approach to care.  Your team is made up of your doctor and two to three APPS. Our APPS (Nurse Practitioners and Physician Assistants) work with your physician to ensure care continuity for you. They are fully qualified to address your health concerns and develop a treatment plan. They communicate directly with your gastroenterologist to care for you. Seeing the Advanced Practice Practitioners on your physician's team can help you by  facilitating care more promptly, often allowing for earlier appointments, access to diagnostic testing, procedures, and other specialty referrals.    You have been scheduled for an endoscopy and colonoscopy. Please follow the written instructions given to you at your visit today.  If you use inhalers (even only as needed), please bring them with you on the day of your procedure.  DO NOT TAKE 7 DAYS PRIOR TO TEST- Trulicity (dulaglutide) Ozempic, Wegovy (semaglutide) Mounjaro (tirzepatide) Bydureon Bcise (exanatide extended release)  DO NOT TAKE 1 DAY PRIOR TO YOUR TEST Rybelsus (semaglutide) Adlyxin (lixisenatide) Victoza (liraglutide) Byetta (exanatide) ____________________________________________________________  Thank you for choosing me and [facility].  Camie Furbish, PA-C

## 2024-09-30 ENCOUNTER — Encounter: Payer: Self-pay | Admitting: Gastroenterology

## 2024-10-12 ENCOUNTER — Encounter: Payer: Self-pay | Admitting: Pharmacist

## 2024-10-12 ENCOUNTER — Encounter: Payer: Self-pay | Admitting: Family

## 2024-10-12 NOTE — Progress Notes (Signed)
 I have reached out to her, thanks for letting me know!

## 2024-10-12 NOTE — Progress Notes (Signed)
 Pharmacy Quality Measure Review  This patient is appearing on a report for being at risk of failing the adherence measure for cholesterol (statin) medications this calendar year.   Medication: atorvastatin  Last fill date: 05/24/24 for 90 day supply Zero refills remaining  Will collaborate with provider to facilitate refill needs. Refill pool cc'd.   Next PCP visit: 11/23/24.

## 2024-10-13 ENCOUNTER — Encounter: Payer: Self-pay | Admitting: Internal Medicine

## 2024-10-20 ENCOUNTER — Ambulatory Visit (AMBULATORY_SURGERY_CENTER): Admitting: Internal Medicine

## 2024-10-20 ENCOUNTER — Encounter: Payer: Self-pay | Admitting: Internal Medicine

## 2024-10-20 VITALS — BP 188/68 | HR 78 | Temp 97.4°F | Resp 20 | Ht 65.0 in | Wt 198.0 lb

## 2024-10-20 DIAGNOSIS — R1030 Lower abdominal pain, unspecified: Secondary | ICD-10-CM | POA: Diagnosis not present

## 2024-10-20 DIAGNOSIS — K2100 Gastro-esophageal reflux disease with esophagitis, without bleeding: Secondary | ICD-10-CM

## 2024-10-20 DIAGNOSIS — D509 Iron deficiency anemia, unspecified: Secondary | ICD-10-CM | POA: Diagnosis not present

## 2024-10-20 DIAGNOSIS — K5140 Inflammatory polyps of colon without complications: Secondary | ICD-10-CM | POA: Diagnosis not present

## 2024-10-20 DIAGNOSIS — E611 Iron deficiency: Secondary | ICD-10-CM

## 2024-10-20 DIAGNOSIS — D120 Benign neoplasm of cecum: Secondary | ICD-10-CM

## 2024-10-20 DIAGNOSIS — D125 Benign neoplasm of sigmoid colon: Secondary | ICD-10-CM | POA: Diagnosis not present

## 2024-10-20 DIAGNOSIS — Z1211 Encounter for screening for malignant neoplasm of colon: Secondary | ICD-10-CM | POA: Diagnosis not present

## 2024-10-20 DIAGNOSIS — K449 Diaphragmatic hernia without obstruction or gangrene: Secondary | ICD-10-CM | POA: Diagnosis not present

## 2024-10-20 DIAGNOSIS — K5730 Diverticulosis of large intestine without perforation or abscess without bleeding: Secondary | ICD-10-CM | POA: Diagnosis not present

## 2024-10-20 DIAGNOSIS — K562 Volvulus: Secondary | ICD-10-CM

## 2024-10-20 DIAGNOSIS — K319 Disease of stomach and duodenum, unspecified: Secondary | ICD-10-CM | POA: Diagnosis not present

## 2024-10-20 DIAGNOSIS — Z860101 Personal history of adenomatous and serrated colon polyps: Secondary | ICD-10-CM

## 2024-10-20 MED ORDER — OMEPRAZOLE 20 MG PO CPDR: 20.0000 mg | DELAYED_RELEASE_CAPSULE | Freq: Every day | ORAL | 3 refills | Status: AC

## 2024-10-20 MED ORDER — OMEPRAZOLE 20 MG PO CPDR: 20.0000 mg | DELAYED_RELEASE_CAPSULE | Freq: Every day | ORAL | 3 refills | Status: DC

## 2024-10-20 MED ORDER — SODIUM CHLORIDE 0.9 % IV SOLN: 500.0000 mL | INTRAVENOUS | Status: DC

## 2024-10-20 NOTE — Op Note (Signed)
 [facility] Patient Name: Terri Crawford Procedure Date: 10/20/2024 3:18 PM MRN: [id number] Endoscopist: Lupita FORBES Commander , MD, [id number] Age: 73 Referring MD:  Date of Birth: Jun 27, 1951 Gender: Female Account #: [id number] Procedure:                Upper GI endoscopy Indications:              Iron deficiency anemia Medicines:                Monitored Anesthesia Care Procedure:                Pre-Anesthesia Assessment:                           - Prior to the procedure, a History and Physical                            was performed, and patient medications and                            allergies were reviewed. The patient's tolerance of                            previous anesthesia was also reviewed. The risks                            and benefits of the procedure and the sedation                            options and risks were discussed with the patient.                            All questions were answered, and informed consent                            was obtained. Prior Anticoagulants: The patient has                            taken no anticoagulant or antiplatelet agents. ASA                            Grade Assessment: II - A patient with mild systemic                            disease. After reviewing the risks and benefits,                            the patient was deemed in satisfactory condition to                            undergo the procedure.                           After obtaining informed consent, the endoscope was  passed under direct vision. Throughout the                            procedure, the patient's blood pressure, pulse, and                            oxygen saturations were monitored continuously. The                            GIF [id number] #[id number] was introduced through the                            mouth, and advanced to the second part of duodenum.                            The upper GI endoscopy was  accomplished without                            difficulty. The patient tolerated the procedure                            well. Scope In: Scope Out: Findings:                 LA Grade B (one or more mucosal breaks greater than                            5 mm, not extending between the tops of two mucosal                            folds) esophagitis with no bleeding was found in                            the distal esophagus. Biopsies were taken with a                            cold forceps for histology. Verification of patient                            identification for the specimen was done. Estimated                            blood loss was minimal.                           A 6 cm hiatal hernia was present.                           The exam was otherwise without abnormality.                           The cardia and gastric fundus were normal on  retroflexion. Complications:            No immediate complications. Estimated Blood Loss:     Estimated blood loss was minimal. Impression:               - LA Grade B reflux esophagitis with no bleeding.                            Biopsied.                           - 6 cm hiatal hernia.                           - The examination was otherwise normal. Recommendation:           - Patient has a contact number available for                            emergencies. The signs and symptoms of potential                            delayed complications were discussed with the                            patient. Return to normal activities tomorrow.                            Written discharge instructions were provided to the                            patient.                           - Resume previous diet.                           - Continue present medications. Start omeprazole  20                            mg daily.                           - Await pathology results.                           - See the  other procedure note for documentation of                            additional recommendations. Lupita FORBES Commander, MD 10/20/2024 4:14:47 PM This report has been signed electronically.

## 2024-10-20 NOTE — Progress Notes (Signed)
 Sedate, gd SR, tolerated procedure well, VSS, report to RN

## 2024-10-20 NOTE — Progress Notes (Signed)
 Called to room to assist during endoscopic procedure.  Patient ID and intended procedure confirmed with present staff. Received instructions for my participation in the procedure from the performing physician.

## 2024-10-20 NOTE — Progress Notes (Signed)
 Pt's states no medical or surgical changes since previsit or office visit.

## 2024-10-20 NOTE — Op Note (Signed)
 [facility] Patient Name: Terri Crawford Procedure Date: 10/20/2024 3:17 PM MRN: [id number] Endoscopist: Lupita FORBES Commander , MD, [id number] Age: 73 Referring MD:  Date of Birth: 1951-03-05 Gender: Female Account #: [id number] Procedure:                Colonoscopy Indications:              Iron deficiency anemia Medicines:                Monitored Anesthesia Care Procedure:                Pre-Anesthesia Assessment:                           - Prior to the procedure, a History and Physical                            was performed, and patient medications and                            allergies were reviewed. The patient's tolerance of                            previous anesthesia was also reviewed. The risks                            and benefits of the procedure and the sedation                            options and risks were discussed with the patient.                            All questions were answered, and informed consent                            was obtained. Prior Anticoagulants: The patient has                            taken no anticoagulant or antiplatelet agents. ASA                            Grade Assessment: II - A patient with mild systemic                            disease. After reviewing the risks and benefits,                            the patient was deemed in satisfactory condition to                            undergo the procedure.                           After obtaining informed consent, the colonoscope  was passed under direct vision. Throughout the                            procedure, the patient's blood pressure, pulse, and                            oxygen saturations were monitored continuously. The                            Olympus Scope SN [id number] was introduced through the                            anus and advanced to the the cecum, identified by                            appendiceal orifice and  ileocecal valve. The                            colonoscopy was performed with moderate difficulty                            due to poor endoscopic visualization and                            significant looping. Successful completion of the                            procedure was aided by using manual pressure,                            straightening and shortening the scope to obtain                            bowel loop reduction, using scope torsion and                            lavage. The patient tolerated the procedure well.                            The quality of the bowel preparation was fair. The                            ileocecal valve, appendiceal orifice, and rectum                            were photographed. The bowel preparation used was                            SUPREP via split dose instruction. Scope In: 3:31:18 PM Scope Out: 4:02:45 PM Scope Withdrawal Time: 0 hours 18 minutes 3 seconds  Total Procedure Duration: 0 hours 31 minutes 27 seconds  Findings:                 The perianal and digital rectal examinations  were                            normal.                           A 6 mm polyp was found in the cecum. The polyp was                            sessile. The polyp was removed with a cold snare.                            Resection and retrieval were complete. Verification                            of patient identification for the specimen was                            done. Estimated blood loss was minimal.                           A diminutive polyp was found in the sigmoid colon.                            The polyp was sessile. The polyp was removed with a                            cold biopsy forceps. Resection and retrieval were                            complete. Verification of patient identification                            for the specimen was done. Estimated blood loss was                            minimal.                            Multiple diverticula were found in the sigmoid                            colon. There was narrowing of the colon in                            association with the diverticular opening.                           The exam was otherwise without abnormality on                            direct and retroflexion views. Complications:            No immediate complications. Estimated Blood Loss:     Estimated blood loss was minimal. Impression:               -  Preparation of the colon was fair.                           - One 6 mm polyp in the cecum, removed with a cold                            snare. Resected and retrieved.                           - One diminutive polyp in the sigmoid colon,                            removed with a cold biopsy forceps. Resected and                            retrieved. Looks inflammatory.                           - Moderate diverticulosis in the sigmoid colon.                            There was narrowing of the colon in association                            with the diverticular opening.                           - The examination was otherwise normal on direct                            and retroflexion views. Recommendation:           - Patient has a contact number available for                            emergencies. The signs and symptoms of potential                            delayed complications were discussed with the                            patient. Return to normal activities tomorrow.                            Written discharge instructions were provided to the                            patient.                           - Resume previous diet.                           - Continue present medications.                           -  Await pathology results. Will need to consider                            earlier repeat given fair prep. Though she is                            better on Linzess  I think would need double prep.                             Would use adult scope - should overcome looping                            better w/ that. Consider abdominal binder also.                           - See the other procedure note for documentation of                            additional recommendations. Lupita FORBES Commander, MD 10/20/2024 4:21:26 PM This report has been signed electronically.

## 2024-10-20 NOTE — Patient Instructions (Addendum)
 Upper exam showed a hiatal hernia, changes of acid reflux. I took biopsies of the esophagus and also the upper intestine (can have a malabsorption issue with iron).  I prescribed omeprazole  to treat the acid refulx.  Colonoscopy revealed 2 polyps and diverticulosis.  I will review pathology and make further recommendations.  I appreciate the opportunity to care for you. Terri CHARLENA Commander, MD, [facility]   Resume previous diet Continue present medications.  Start omeprazole  20 mg daily Await pathology results  YOU HAD AN ENDOSCOPIC PROCEDURE TODAY AT THE [facility]:   Refer to the procedure report that was given to you for any specific questions about what was found during the examination.  If the procedure report does not answer your questions, please call your gastroenterologist to clarify.  If you requested that your care partner not be given the details of your procedure findings, then the procedure report has been included in a sealed envelope for you to review at your convenience later.  YOU SHOULD EXPECT: Some feelings of bloating in the abdomen. Passage of more gas than usual.  Walking can help get rid of the air that was put into your GI tract during the procedure and reduce the bloating. If you had a lower endoscopy (such as a colonoscopy or flexible sigmoidoscopy) you may notice spotting of blood in your stool or on the toilet paper. If you underwent a bowel prep for your procedure, you may not have a normal bowel movement for a few days.  Please Note:  You might notice some irritation and congestion in your nose or some drainage.  This is from the oxygen used during your procedure.  There is no need for concern and it should clear up in a day or so.  SYMPTOMS TO REPORT IMMEDIATELY:  Following lower endoscopy (colonoscopy or flexible sigmoidoscopy):  Excessive amounts of blood in the stool  Significant tenderness or worsening of abdominal pains  Swelling of the abdomen that  is new, acute  Fever of 100F or higher  Following upper endoscopy (EGD)  Vomiting of blood or coffee ground material  New chest pain or pain under the shoulder blades  Painful or persistently difficult swallowing  New shortness of breath  Fever of 100F or higher  Black, tarry-looking stools  For urgent or emergent issues, a gastroenterologist can be reached at any hour by calling [phone]. Do not use MyChart messaging for urgent concerns.    DIET:  We do recommend a small meal at first, but then you may proceed to your regular diet.  Drink plenty of fluids but you should avoid alcoholic beverages for 24 hours.  ACTIVITY:  You should plan to take it easy for the rest of today and you should NOT DRIVE or use heavy machinery until tomorrow (because of the sedation medicines used during the test).    FOLLOW UP: Our staff will call the number listed on your records the next business day following your procedure.  We will call around 7:15- 8:00 am to check on you and address any questions or concerns that you may have regarding the information given to you following your procedure. If we do not reach you, we will leave a message.     If any biopsies were taken you will be contacted by phone or by letter within the next 1-3 weeks.  Please call us  at [phone] if you have not heard about the biopsies in 3 weeks.    SIGNATURES/CONFIDENTIALITY: You and/or  your care partner have signed paperwork which will be entered into your electronic medical record.  These signatures attest to the fact that that the information above on your After Visit Summary has been reviewed and is understood.  Full responsibility of the confidentiality of this discharge information lies with you and/or your care-partner.

## 2024-10-20 NOTE — Progress Notes (Signed)
 History and Physical Interval Note:  10/20/2024 3:19 PM  Terri Crawford  has presented today for endoscopic procedure(s), with the diagnosis of  Encounter Diagnoses  Name Primary?   Iron deficiency Yes   History of adenomatous polyp of colon    Lower abdominal pain   .  The various methods of evaluation and treatment have been discussed with the patient and/or family. After consideration of risks, benefits and other options for treatment, the patient has consented to  the endoscopic procedure(s).   The patient's history has been reviewed, patient examined, no change in status, stable for endoscopic procedure(s).  I have reviewed the patient's chart and labs.  Questions were answered to the patient's satisfaction.     [name], MD, [facility]

## 2024-10-21 ENCOUNTER — Telehealth: Payer: Self-pay

## 2024-10-21 NOTE — Telephone Encounter (Signed)
  Follow up Call-     10/20/2024    2:37 PM  Call back number  Post procedure Call Back phone  # [phone]  Permission to leave phone message Yes     Patient questions:  Do you have a fever, pain , or abdominal swelling? No. Pain Score  0 *  Have you tolerated food without any problems? Yes.    Have you been able to return to your normal activities? Yes.    Do you have any questions about your discharge instructions: Diet   No. Medications  No. Follow up visit  No.  Do you have questions or concerns about your Care? No.  Actions: * If pain score is 4 or above: No action needed, pain <4.

## 2024-10-25 LAB — SURGICAL PATHOLOGY

## 2024-11-01 ENCOUNTER — Other Ambulatory Visit: Payer: Self-pay | Admitting: Internal Medicine

## 2024-11-05 ENCOUNTER — Ambulatory Visit

## 2024-11-09 ENCOUNTER — Ambulatory Visit: Payer: Self-pay | Admitting: Internal Medicine

## 2024-11-23 ENCOUNTER — Encounter: Payer: Self-pay | Admitting: Family

## 2024-11-23 ENCOUNTER — Ambulatory Visit (INDEPENDENT_AMBULATORY_CARE_PROVIDER_SITE_OTHER)
Admission: RE | Admit: 2024-11-23 | Discharge: 2024-11-23 | Disposition: A | Source: Ambulatory Visit | Attending: Family

## 2024-11-23 ENCOUNTER — Ambulatory Visit: Payer: Self-pay | Admitting: Family

## 2024-11-23 ENCOUNTER — Ambulatory Visit: Admitting: Family

## 2024-11-23 VITALS — BP 126/84 | HR 76 | Temp 98.0°F | Ht 65.0 in | Wt 199.6 lb

## 2024-11-23 DIAGNOSIS — K449 Diaphragmatic hernia without obstruction or gangrene: Secondary | ICD-10-CM | POA: Diagnosis not present

## 2024-11-23 DIAGNOSIS — R062 Wheezing: Secondary | ICD-10-CM

## 2024-11-23 DIAGNOSIS — Z1231 Encounter for screening mammogram for malignant neoplasm of breast: Secondary | ICD-10-CM

## 2024-11-23 DIAGNOSIS — Z780 Asymptomatic menopausal state: Secondary | ICD-10-CM

## 2024-11-23 NOTE — Progress Notes (Signed)
 Established Patient Office Visit  Subjective:      CC:  Chief Complaint  Patient presents with   Follow-up    3 month follow up for constipation     HPI: Terri Crawford is a 73 y.o. female presenting on 11/23/2024 for Follow-up (3 month follow up for constipation ) .  Discussed the use of AI scribe software for clinical note transcription with the patient, who gave verbal consent to proceed.  History of Present Illness Terri Crawford is a 73 year old female who presents for follow-up after recent endoscopy and colonoscopy.  She continues to experience constipation. A colonoscopy on October 20, 2024, revealed one adenoma and one inflammatory polyp. The preparation for the procedure was not optimal, necessitating a potential repeat colonoscopy in a year. She is taking Linzess  290 mcg once daily, which has been beneficial for her symptoms. Additionally, she takes a slow-release iron supplement, which has improved her constipation.  An endoscopy showed esophagitis. She has a known hiatal hernia. Omeprazole  has been reintroduced to her regimen, and she reports significant improvement with the current treatment plan.  She is taking vitamin D  and B12 supplements daily due to previously low B12 levels. She feels much better with these supplements.  No shortness of breath. Reports frequent congestion. No history of asthma. Experiences gurgling sounds, which are suspected to be related to her stomach.       Wt Readings from Last 3 Encounters:  11/23/24 199 lb 9.6 oz (90.5 kg)  10/20/24 198 lb (89.8 kg)  09/29/24 198 lb (89.8 kg)          Social history:  Relevant past medical, surgical, family and social history reviewed and updated as indicated. Interim medical history since our last visit reviewed.  Allergies and medications reviewed and updated.  DATA REVIEWED: CHART IN EPIC     ROS: Negative unless specifically indicated above in HPI.    Current  Outpatient Medications:    atorvastatin  (LIPITOR) 20 MG tablet, TAKE 1 TABLET BY MOUTH EVERY DAY, Disp: 90 tablet, Rfl: 0   Ferrous Sulfate (SLOW FE PO), Take 1 each by mouth every other day., Disp: , Rfl:    latanoprost (XALATAN) 0.005 % ophthalmic solution, 1 drop at bedtime., Disp: , Rfl:    linaclotide  (LINZESS ) 290 MCG CAPS capsule, Take 1 capsule (290 mcg total) by mouth daily before breakfast., Disp: 90 capsule, Rfl: 0   Multiple Vitamins-Minerals (VISION FORMULA EYE HEALTH) CAPS, Take 2 capsules by mouth daily., Disp: , Rfl:    omeprazole  (PRILOSEC) 20 MG capsule, Take 1 capsule (20 mg total) by mouth daily., Disp: 90 capsule, Rfl: 3   Povidone, PF, (IVIZIA DRY EYES) 0.5 % SOLN, Apply to eye., Disp: , Rfl:    timolol (TIMOPTIC) 0.5 % ophthalmic solution, SMARTSIG:In Eye(s), Disp: , Rfl:         Objective:        BP 126/84 (BP Location: Left Arm, Patient Position: Sitting, Cuff Size: Large)   Pulse 76   Temp 98 F (36.7 C) (Temporal)   Ht 5' 5 (1.651 m)   Wt 199 lb 9.6 oz (90.5 kg)   SpO2 98%   BMI 33.22 kg/m   Physical Exam CHEST: Fleeting wheezes and unusual gurgling sounds on auscultation, likely from stomach. CARDIOVASCULAR: Heart normal.  Wt Readings from Last 3 Encounters:  11/23/24 199 lb 9.6 oz (90.5 kg)  10/20/24 198 lb (89.8 kg)  09/29/24 198 lb (89.8 kg)    Physical  Exam Vitals reviewed.  Constitutional:      General: She is not in acute distress.    Appearance: Normal appearance. She is normal weight. She is not ill-appearing, toxic-appearing or diaphoretic.  HENT:     Head: Normocephalic.  Cardiovascular:     Rate and Rhythm: Normal rate and regular rhythm.  Pulmonary:     Effort: Pulmonary effort is normal.     Breath sounds: Wheezing (right upper and right lower lobe expiratory fleeting wheeze) present.  Musculoskeletal:        General: Normal range of motion.  Neurological:     General: No focal deficit present.     Mental Status: She is  alert and oriented to person, place, and time. Mental status is at baseline.  Psychiatric:        Mood and Affect: Mood normal.        Behavior: Behavior normal.        Thought Content: Thought content normal.        Judgment: Judgment normal.          Results DIAGNOSTIC Endoscopy: Esophagitis, duodenal biopsies, no celiac disease (10/20/2024) Colonoscopy: One adenoma, one inflammatory polyp, fair preparation (10/20/2024)  Assessment & Plan:   Assessment and Plan Assessment & Plan Constipation Chronic constipation managed with Linzess , resulting in improved bowel movements. - Continue Linzess  as prescribed.  Esophagitis and hiatal hernia Esophagitis confirmed by endoscopy, likely secondary to hiatal hernia. Managed with omeprazole . - Continue omeprazole  as prescribed.  Colonic adenoma and inflammatory polyp, post-polypectomy surveillance Colonoscopy revealed one adenoma and one inflammatory polyp. Adequate preparation for next colonoscopy is necessary. - Will schedule follow-up colonoscopy in one year with improved bowel preparation.  Wheezing Intermittent wheezing without shortness of breath. Differential includes gastrointestinal noise versus pulmonary cause. No asthma history. - Ordered chest x-ray to evaluate wheezing.        Return in about 6 months (around 05/24/2025) for f/u CPE.     Ginger Patrick, MSN, APRN, FNP-C [facility]

## 2024-11-23 NOTE — Patient Instructions (Signed)
  I have sent an electronic order over to your preferred location for the following:   []   2D Mammogram  [x]   3D Mammogram  [x]   Bone Density   Please give this center a call to get scheduled at your convenience.   [x]   [facility]      [address] [place], [place]        [phone]         Make sure to wear two piece  clothing  No lotions powders or deodorants the day of the appointment Make sure to bring picture ID and insurance card.  Bring list of medications you are currently taking including any supplements.    ------------------------------------

## 2024-12-01 DIAGNOSIS — H35373 Puckering of macula, bilateral: Secondary | ICD-10-CM | POA: Diagnosis not present

## 2024-12-01 DIAGNOSIS — H353223 Exudative age-related macular degeneration, left eye, with inactive scar: Secondary | ICD-10-CM | POA: Diagnosis not present

## 2024-12-01 DIAGNOSIS — H353124 Nonexudative age-related macular degeneration, left eye, advanced atrophic with subfoveal involvement: Secondary | ICD-10-CM | POA: Diagnosis not present

## 2024-12-01 DIAGNOSIS — H353113 Nonexudative age-related macular degeneration, right eye, advanced atrophic without subfoveal involvement: Secondary | ICD-10-CM | POA: Diagnosis not present

## 2024-12-01 DIAGNOSIS — H43813 Vitreous degeneration, bilateral: Secondary | ICD-10-CM | POA: Diagnosis not present

## 2024-12-01 DIAGNOSIS — H353211 Exudative age-related macular degeneration, right eye, with active choroidal neovascularization: Secondary | ICD-10-CM | POA: Diagnosis not present

## 2024-12-01 DIAGNOSIS — H15833 Staphyloma posticum, bilateral: Secondary | ICD-10-CM | POA: Diagnosis not present

## 2024-12-01 DIAGNOSIS — H442E3 Degenerative myopia with other maculopathy, bilateral eye: Secondary | ICD-10-CM | POA: Diagnosis not present

## 2024-12-31 ENCOUNTER — Ambulatory Visit

## 2024-12-31 VITALS — Ht 65.0 in | Wt 199.0 lb

## 2024-12-31 DIAGNOSIS — Z1231 Encounter for screening mammogram for malignant neoplasm of breast: Secondary | ICD-10-CM

## 2024-12-31 DIAGNOSIS — Z0000 Encounter for general adult medical examination without abnormal findings: Secondary | ICD-10-CM

## 2024-12-31 NOTE — Progress Notes (Signed)
 "  Please attest and cosign this visit due to patients primary care provider not being in the office at the time the visit was completed.   Chief Complaint  Patient presents with   Medicare Wellness     Subjective:   Terri Crawford is a 74 y.o. female who presents for a Medicare Annual Wellness Visit.  Visit info / Clinical Intake: Medicare Wellness Visit Type:: Subsequent Annual Wellness Visit Persons participating in visit and providing information:: patient Medicare Wellness Visit Mode:: Telephone If telephone:: video declined Since this visit was completed virtually, some vitals may be partially provided or unavailable. Missing vitals are due to the limitations of the virtual format.: Unable to obtain vitals - no equipment If Telephone or Video please confirm:: I connected with patient using audio/video enable telemedicine. I verified patient identity with two identifiers, discussed telehealth limitations, and patient agreed to proceed. Patient Location:: home Provider Location:: home office Interpreter Needed?: No Pre-visit prep was completed: yes AWV questionnaire completed by patient prior to visit?: yes Date:: 12/31/24 Living arrangements:: (!) (Patient-Rptd) lives alone Patient's Overall Health Status Rating: (Patient-Rptd) good Typical amount of pain: (Patient-Rptd) none Does pain affect daily life?: (Patient-Rptd) no Are you currently prescribed opioids?: no  Dietary Habits and Nutritional Risks How many meals a day?: (Patient-Rptd) 3 Eats fruit and vegetables daily?: (Patient-Rptd) yes Most meals are obtained by: (Patient-Rptd) preparing own meals In the last 2 weeks, have you had any of the following?: none Diabetic:: no  Functional Status Activities of Daily Living (to include ambulation/medication): (Patient-Rptd) Independent Ambulation: (Patient-Rptd) Independent Medication Administration: (Patient-Rptd) Independent Home Management (perform basic housework  or laundry): (Patient-Rptd) Independent Manage your own finances?: (Patient-Rptd) yes Primary transportation is: (Patient-Rptd) family / friends Concerns about vision?: (!) yes Concerns about hearing?: no  Fall Screening Falls in the past year?: (Patient-Rptd) 0 Number of falls in past year: 0 Was there an injury with Fall?: 0 Fall Risk Category Calculator: 0 Patient Fall Risk Level: Low Fall Risk  Fall Risk Patient at Risk for Falls Due to: No Fall Risks Fall risk Follow up: Falls evaluation completed; Education provided; Falls prevention discussed  Home and Transportation Safety: All rugs have non-skid backing?: (Patient-Rptd) yes All stairs or steps have railings?: (Patient-Rptd) N/A, no stairs Grab bars in the bathtub or shower?: (!) (Patient-Rptd) no Have non-skid surface in bathtub or shower?: (Patient-Rptd) yes Good home lighting?: (Patient-Rptd) yes Regular seat belt use?: (Patient-Rptd) yes Hospital stays in the last year:: (Patient-Rptd) no  Cognitive Assessment Difficulty concentrating, remembering, or making decisions? : (Patient-Rptd) no Will 6CIT or Mini Cog be Completed: yes What year is it?: 0 points What month is it?: 0 points Give patient an address phrase to remember (5 components): [address] [place]  About what time is it?: 0 points Count backwards from 20 to 1: 0 points Say the months of the year in reverse: 0 points Repeat the address phrase from earlier: 0 points 6 CIT Score: 0 points  Advance Directives (For Healthcare) Does Patient Have a Medical Advance Directive?: Yes Does patient want to make changes to medical advance directive?: No - Patient declined Type of Advance Directive: Healthcare Power of [place]; Living will Copy of Healthcare Power of Attorney in Chart?: Yes - validated most recent copy scanned in chart (See row information) Copy of Living Will in Chart?: Yes - validated most recent copy scanned in chart (See row  information)  Reviewed/Updated  Reviewed/Updated: Reviewed All (Medical, Surgical, Family, Medications, Allergies, Care Teams,  Patient Goals)    Allergies (verified) Phenobarbital and Codeine   Current Medications (verified) Outpatient Encounter Medications as of 12/31/2024  Medication Sig   atorvastatin  (LIPITOR) 20 MG tablet TAKE 1 TABLET BY MOUTH EVERY DAY   Ferrous Sulfate (SLOW FE PO) Take 1 each by mouth every other day.   latanoprost (XALATAN) 0.005 % ophthalmic solution 1 drop at bedtime.   linaclotide  (LINZESS ) 290 MCG CAPS capsule Take 1 capsule (290 mcg total) by mouth daily before breakfast.   Multiple Vitamins-Minerals (VISION FORMULA EYE HEALTH) CAPS Take 2 capsules by mouth daily.   omeprazole  (PRILOSEC) 20 MG capsule Take 1 capsule (20 mg total) by mouth daily.   Povidone, PF, (IVIZIA DRY EYES) 0.5 % SOLN Apply to eye.   timolol (TIMOPTIC) 0.5 % ophthalmic solution SMARTSIG:In Eye(s)   No facility-administered encounter medications on file as of 12/31/2024.    History: Past Medical History:  Diagnosis Date   Allergy    Basal cell carcinoma    Cataract    Chicken pox    GERD (gastroesophageal reflux disease)    Glaucoma    Heart murmur    History of blood transfusion    History of stomach ulcers    Hx of adenomatous polyp of colon 09/05/2017   Post-operative nausea and vomiting    slow to wake up   Past Surgical History:  Procedure Laterality Date   CATARACT EXTRACTION, BILATERAL     CESAREAN SECTION     MANDIBLE FRACTURE SURGERY  1973   SKIN CANCER EXCISION     TUBAL LIGATION     Family History  Problem Relation Age of Onset   Arthritis Mother    Uterine cancer Mother    Diabetes Mother    Cancer Mother    Heart disease Father    Cancer Maternal Uncle    Throat cancer Maternal Grandmother    Diabetes Maternal Grandmother    Cancer Maternal Grandmother    Prostate cancer Maternal Grandfather    Pancreatic cancer Maternal Grandfather    Cancer  Maternal Grandfather    Colon cancer Neg Hx    Esophageal cancer Neg Hx    Rectal cancer Neg Hx    Stomach cancer Neg Hx    Social History   Occupational History   Occupation: Network Engineer   Occupation: Optometrist  Tobacco Use   Smoking status: Former    Types: Cigarettes   Smokeless tobacco: Never  Vaping Use   Vaping status: Never Used  Substance and Sexual Activity   Alcohol use: Not Currently   Drug use: No   Sexual activity: Not Currently    Partners: Male   Tobacco Counseling Counseling given: Not Answered  SDOH Screenings   Food Insecurity: No Food Insecurity (12/31/2024)  Housing: Low Risk (12/31/2024)  Transportation Needs: No Transportation Needs (12/31/2024)  Utilities: Not At Risk (12/31/2024)  Alcohol Screen: Low Risk (05/16/2023)  Depression (PHQ2-9): Low Risk (12/31/2024)  Financial Resource Strain: Low Risk (12/31/2024)  Physical Activity: Sufficiently Active (12/31/2024)  Social Connections: Moderately Integrated (12/31/2024)  Stress: No Stress Concern Present (12/31/2024)  Tobacco Use: Medium Risk (12/31/2024)  Health Literacy: Adequate Health Literacy (12/31/2024)   See flowsheets for full screening details  Depression Screen PHQ 2 & 9 Depression Scale- Over the past 2 weeks, how often have you been bothered by any of the following problems? Little interest or pleasure in doing things: 0 Feeling down, depressed, or hopeless (PHQ Adolescent also includes...irritable): 0 PHQ-2 Total Score: 0 Trouble  falling or staying asleep, or sleeping too much: 2 Feeling tired or having little energy: 2 Poor appetite or overeating (PHQ Adolescent also includes...weight loss): 0 Feeling bad about yourself - or that you are a failure or have let yourself or your family down: 0 Trouble concentrating on things, such as reading the newspaper or watching television (PHQ Adolescent also includes...like school work): 0 Moving or speaking so slowly that other people could have noticed.  Or the opposite - being so fidgety or restless that you have been moving around a lot more than usual: 0 Thoughts that you would be better off dead, or of hurting yourself in some way: 0 PHQ-9 Total Score: 5 If you checked off any problems, how difficult have these problems made it for you to do your work, take care of things at home, or get along with other people?: Somewhat difficult     Goals Addressed             This Visit's Progress    Activity and Exercise Increased   On track    Evidence-based guidance:  Review current exercise levels.  Assess patient perspective on exercise or activity level, barriers to increasing activity, motivation and readiness for change.  Recommend or set healthy exercise goal based on individual tolerance.  Encourage small steps toward making change in amount of exercise or activity.  Urge reduction of sedentary activities or screen time.  Promote group activities within the community or with family or support person.  Consider referral to rehabiliation therapist for assessment and exercise/activity plan.   Notes:      DIET - EAT MORE FRUITS AND VEGETABLES   On track            Objective:    Today's Vitals   12/31/24 1312  Weight: 199 lb (90.3 kg)  Height: 5' 5 (1.651 m)   Body mass index is 33.12 kg/m.  Hearing/Vision screen No results found. Immunizations and Health Maintenance Health Maintenance  Topic Date Due   Mammogram  Never done   Bone Density Scan  Never done   Zoster Vaccines- Shingrix (2 of 2) 12/11/2020   Medicare Annual Wellness (AWV)  05/15/2024   Influenza Vaccine  03/22/2025 (Originally 07/23/2024)   Colonoscopy  10/20/2025   DTaP/Tdap/Td (3 - Td or Tdap) 01/14/2033   Pneumococcal Vaccine: 50+ Years  Completed   Hepatitis C Screening  Completed   Meningococcal B Vaccine  Aged Out   COVID-19 Vaccine  Discontinued        Assessment/Plan:  This is a routine wellness examination for Terri Crawford.  Patient Care  Team: Corwin Antu, FNP as PCP - General (Family Medicine) Jarold Mayo, MD as Consulting Physician (Ophthalmology) Corbin Mabel NOVAK, MD as Consulting Physician (Ophthalmology) Avram Lupita BRAVO, MD as Consulting Physician (Gastroenterology)  I have personally reviewed and noted the following in the patients chart:   Medical and social history Use of alcohol, tobacco or illicit drugs  Current medications and supplements including opioid prescriptions. Functional ability and status Nutritional status Physical activity Advanced directives List of other physicians Hospitalizations, surgeries, and ER visits in previous 12 months Vitals Screenings to include cognitive, depression, and falls Referrals and appointments  No orders of the defined types were placed in this encounter.  In addition, I have reviewed and discussed with patient certain preventive protocols, quality metrics, and best practice recommendations. A written personalized care plan for preventive services as well as general preventive health recommendations were provided to patient.  Terri LITTIE Saris, LPN   [id number]    After Visit Summary: (MyChart) Due to this being a telephonic visit, the after visit summary with patients personalized plan was offered to patient via MyChart   Nurse Notes: No voiced or noted concerns at this time Patient advised to keep follow-up appointment with PCP (Feb 2026) Vaccines not given: Will obtain Shingles at pharmacy Pt will talk w/PCP re:Shingles as she had 1st dose in 2021 "

## 2024-12-31 NOTE — Patient Instructions (Signed)
 Terri Crawford,  Thank you for taking the time for your Medicare Wellness Visit. I appreciate your continued commitment to your health goals. Please review the care plan we discussed, and feel free to reach out if I can assist you further.  Please note that Annual Wellness Visits do not include a physical exam. Some assessments may be limited, especially if the visit was conducted virtually. If needed, we may recommend an in-person follow-up with your provider.  Ongoing Care Seeing your primary care provider every 3 to 6 months helps us  monitor your health and provide consistent, personalized care.   Referrals If a referral was made during today's visit and you haven't received any updates within two weeks, please contact the referred provider directly to check on the status.  Recommended Screenings:  Health Maintenance  Topic Date Due   Breast Cancer Screening  Never done   Osteoporosis screening with Bone Density Scan  Never done   Zoster (Shingles) Vaccine (2 of 2) 12/11/2020   Medicare Annual Wellness Visit  05/15/2024   Flu Shot  03/22/2025*   Colon Cancer Screening  10/20/2025   DTaP/Tdap/Td vaccine (3 - Td or Tdap) 01/14/2033   Pneumococcal Vaccine for age over 86  Completed   Hepatitis C Screening  Completed   Meningitis B Vaccine  Aged Out   COVID-19 Vaccine  Discontinued  *Topic was postponed. The date shown is not the original due date.       12/31/2024   12:50 PM  Advanced Directives  Does Patient Have a Medical Advance Directive? Yes  Type of Estate Agent of [place];Living will  Does patient want to make changes to medical advance directive? No - Patient declined  Copy of Healthcare Power of Attorney in Chart? Yes - validated most recent copy scanned in chart (See row information)    Vision: Annual vision screenings are recommended for early detection of glaucoma, cataracts, and diabetic retinopathy. These exams can also reveal signs of chronic  conditions such as diabetes and high blood pressure.  Dental: Annual dental screenings help detect early signs of oral cancer, gum disease, and other conditions linked to overall health, including heart disease and diabetes.  Please see the attached documents for additional preventive care recommendations.

## 2025-01-26 ENCOUNTER — Other Ambulatory Visit: Payer: Self-pay | Admitting: Family

## 2025-01-31 ENCOUNTER — Ambulatory Visit: Admitting: Family
